# Patient Record
Sex: Female | Born: 1960 | Race: White | Hispanic: No | Marital: Married | Smoking: Former smoker
Health system: Southern US, Community
[De-identification: ages and names within clinical notes are randomized; demographics above are authoritative.]

## PROBLEM LIST (undated history)

## (undated) DIAGNOSIS — E119 Type 2 diabetes mellitus without complications: Secondary | ICD-10-CM

## (undated) DIAGNOSIS — L409 Psoriasis, unspecified: Secondary | ICD-10-CM

## (undated) DIAGNOSIS — I10 Essential (primary) hypertension: Secondary | ICD-10-CM

## (undated) DIAGNOSIS — N809 Endometriosis, unspecified: Secondary | ICD-10-CM

## (undated) HISTORY — DX: Type 2 diabetes mellitus without complications: E11.9

## (undated) HISTORY — PX: LIPOMA EXCISION: SHX5283

## (undated) HISTORY — PX: OTHER SURGICAL HISTORY: SHX169

## (undated) HISTORY — PX: ENDOMETRIAL ABLATION: SHX621

---

## 1993-03-02 HISTORY — PX: BREAST CYST ASPIRATION: SHX578

## 1997-08-13 ENCOUNTER — Ambulatory Visit (HOSPITAL_COMMUNITY): Admission: RE | Admit: 1997-08-13 | Discharge: 1997-08-13 | Payer: Self-pay | Admitting: Obstetrics and Gynecology

## 1999-09-19 ENCOUNTER — Other Ambulatory Visit: Admission: RE | Admit: 1999-09-19 | Discharge: 1999-09-19 | Payer: Self-pay | Admitting: Obstetrics and Gynecology

## 2000-03-09 ENCOUNTER — Ambulatory Visit (HOSPITAL_COMMUNITY): Admission: RE | Admit: 2000-03-09 | Discharge: 2000-03-09 | Payer: Self-pay | Admitting: Obstetrics and Gynecology

## 2000-03-09 ENCOUNTER — Encounter: Payer: Self-pay | Admitting: Obstetrics and Gynecology

## 2000-03-17 ENCOUNTER — Encounter: Payer: Self-pay | Admitting: Obstetrics and Gynecology

## 2000-03-17 ENCOUNTER — Encounter: Admission: RE | Admit: 2000-03-17 | Discharge: 2000-03-17 | Payer: Self-pay | Admitting: Obstetrics and Gynecology

## 2000-09-10 ENCOUNTER — Encounter: Payer: Self-pay | Admitting: Obstetrics and Gynecology

## 2000-09-10 ENCOUNTER — Encounter: Admission: RE | Admit: 2000-09-10 | Discharge: 2000-09-10 | Payer: Self-pay | Admitting: Obstetrics and Gynecology

## 2001-05-20 ENCOUNTER — Encounter: Admission: RE | Admit: 2001-05-20 | Discharge: 2001-08-18 | Payer: Self-pay | Admitting: Family Medicine

## 2001-06-08 ENCOUNTER — Encounter: Payer: Self-pay | Admitting: Obstetrics and Gynecology

## 2001-06-08 ENCOUNTER — Encounter: Admission: RE | Admit: 2001-06-08 | Discharge: 2001-06-08 | Payer: Self-pay | Admitting: Obstetrics and Gynecology

## 2002-02-21 ENCOUNTER — Other Ambulatory Visit: Admission: RE | Admit: 2002-02-21 | Discharge: 2002-02-21 | Payer: Self-pay | Admitting: Obstetrics and Gynecology

## 2002-06-19 ENCOUNTER — Encounter: Payer: Self-pay | Admitting: Obstetrics and Gynecology

## 2002-06-19 ENCOUNTER — Encounter: Admission: RE | Admit: 2002-06-19 | Discharge: 2002-06-19 | Payer: Self-pay | Admitting: Obstetrics and Gynecology

## 2003-04-30 ENCOUNTER — Other Ambulatory Visit: Admission: RE | Admit: 2003-04-30 | Discharge: 2003-04-30 | Payer: Self-pay | Admitting: Obstetrics and Gynecology

## 2003-06-20 ENCOUNTER — Encounter: Admission: RE | Admit: 2003-06-20 | Discharge: 2003-06-20 | Payer: Self-pay | Admitting: Obstetrics and Gynecology

## 2004-05-06 ENCOUNTER — Other Ambulatory Visit: Admission: RE | Admit: 2004-05-06 | Discharge: 2004-05-06 | Payer: Self-pay | Admitting: Obstetrics and Gynecology

## 2004-07-22 ENCOUNTER — Encounter: Admission: RE | Admit: 2004-07-22 | Discharge: 2004-07-22 | Payer: Self-pay | Admitting: Obstetrics and Gynecology

## 2005-07-23 ENCOUNTER — Encounter: Admission: RE | Admit: 2005-07-23 | Discharge: 2005-07-23 | Payer: Self-pay | Admitting: Obstetrics and Gynecology

## 2006-07-28 ENCOUNTER — Encounter: Admission: RE | Admit: 2006-07-28 | Discharge: 2006-07-28 | Payer: Self-pay | Admitting: Obstetrics and Gynecology

## 2006-07-30 ENCOUNTER — Encounter: Admission: RE | Admit: 2006-07-30 | Discharge: 2006-07-30 | Payer: Self-pay | Admitting: Obstetrics and Gynecology

## 2007-07-05 ENCOUNTER — Encounter: Admission: RE | Admit: 2007-07-05 | Discharge: 2007-07-05 | Payer: Self-pay | Admitting: Obstetrics and Gynecology

## 2008-08-14 ENCOUNTER — Encounter: Admission: RE | Admit: 2008-08-14 | Discharge: 2008-08-14 | Payer: Self-pay | Admitting: Obstetrics and Gynecology

## 2008-08-27 ENCOUNTER — Encounter: Admission: RE | Admit: 2008-08-27 | Discharge: 2008-08-27 | Payer: Self-pay | Admitting: Obstetrics and Gynecology

## 2009-08-27 ENCOUNTER — Encounter: Admission: RE | Admit: 2009-08-27 | Discharge: 2009-08-27 | Payer: Self-pay | Admitting: Obstetrics and Gynecology

## 2010-02-18 IMAGING — MG MM DIAGNOSTIC LTD LEFT
2 series · 2 of 2 positions shown · non-contrast
Comparison: 07/05/2007, and 07/28/2006, 07/23/2005

CLINICAL DATA: Asymmetry left breast associated with calcifications

DIGITAL DIAGNOSTIC LEFT MAMMOGRAM

[L CC]
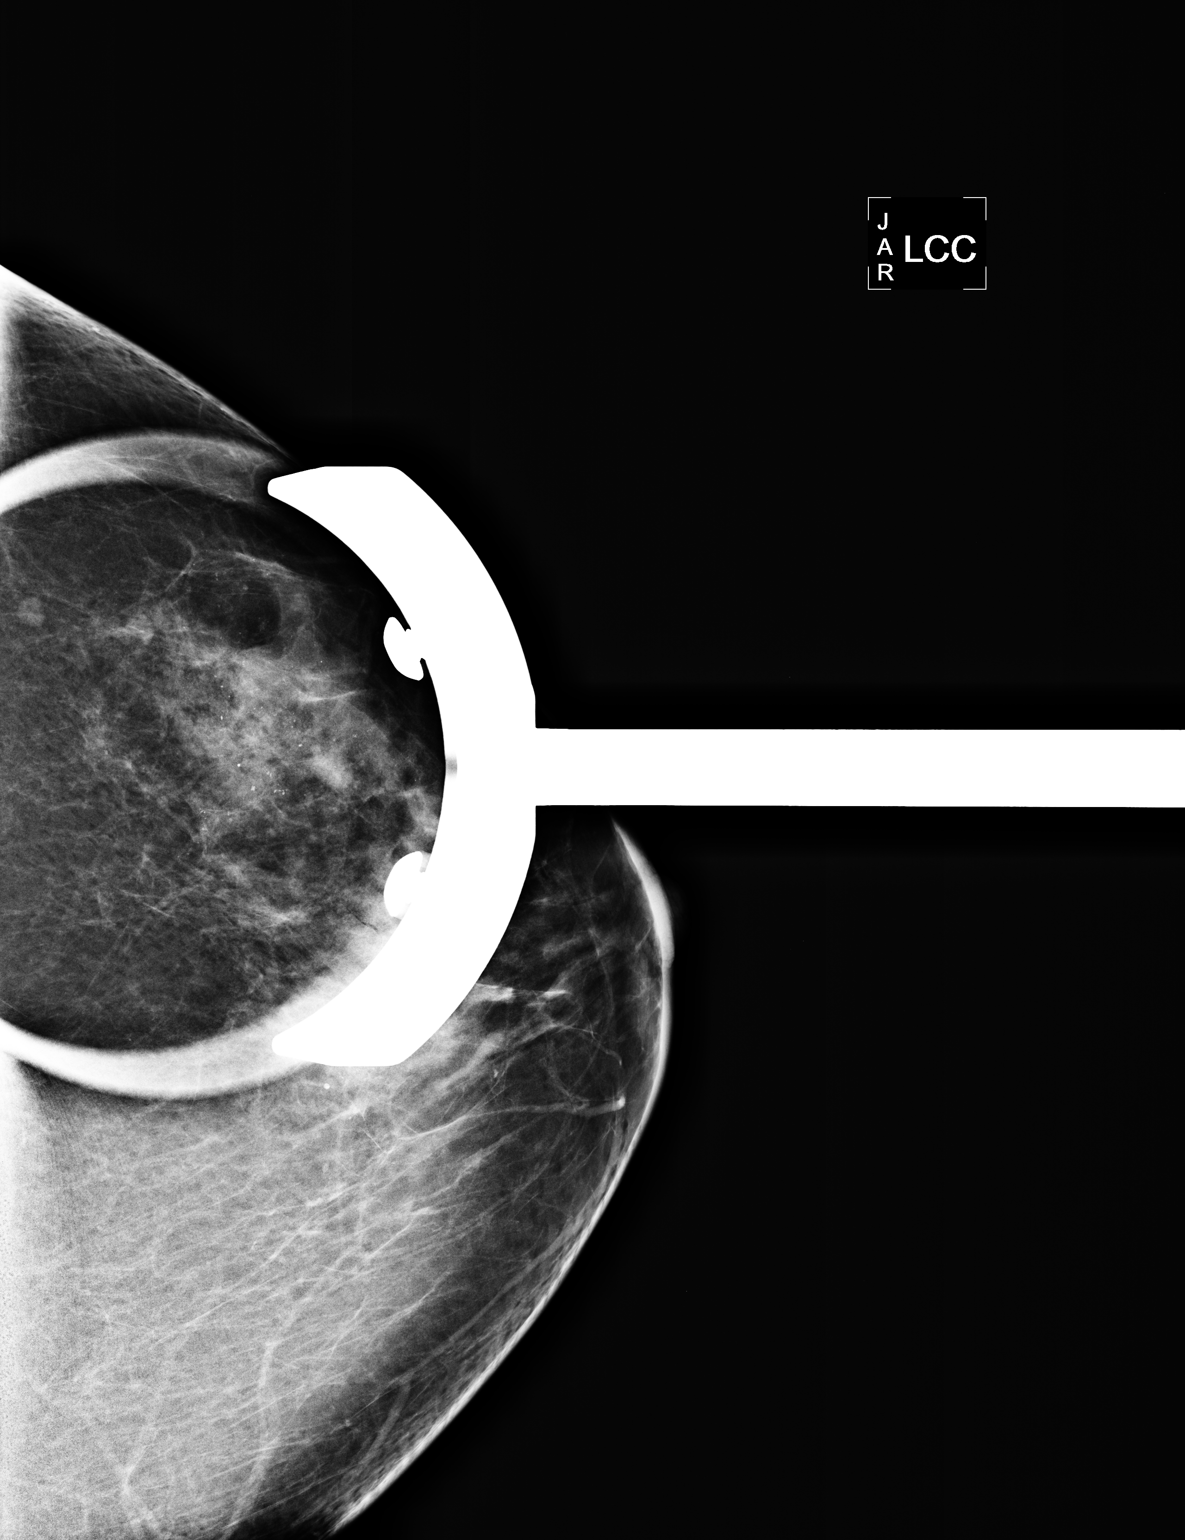

[L MLO]
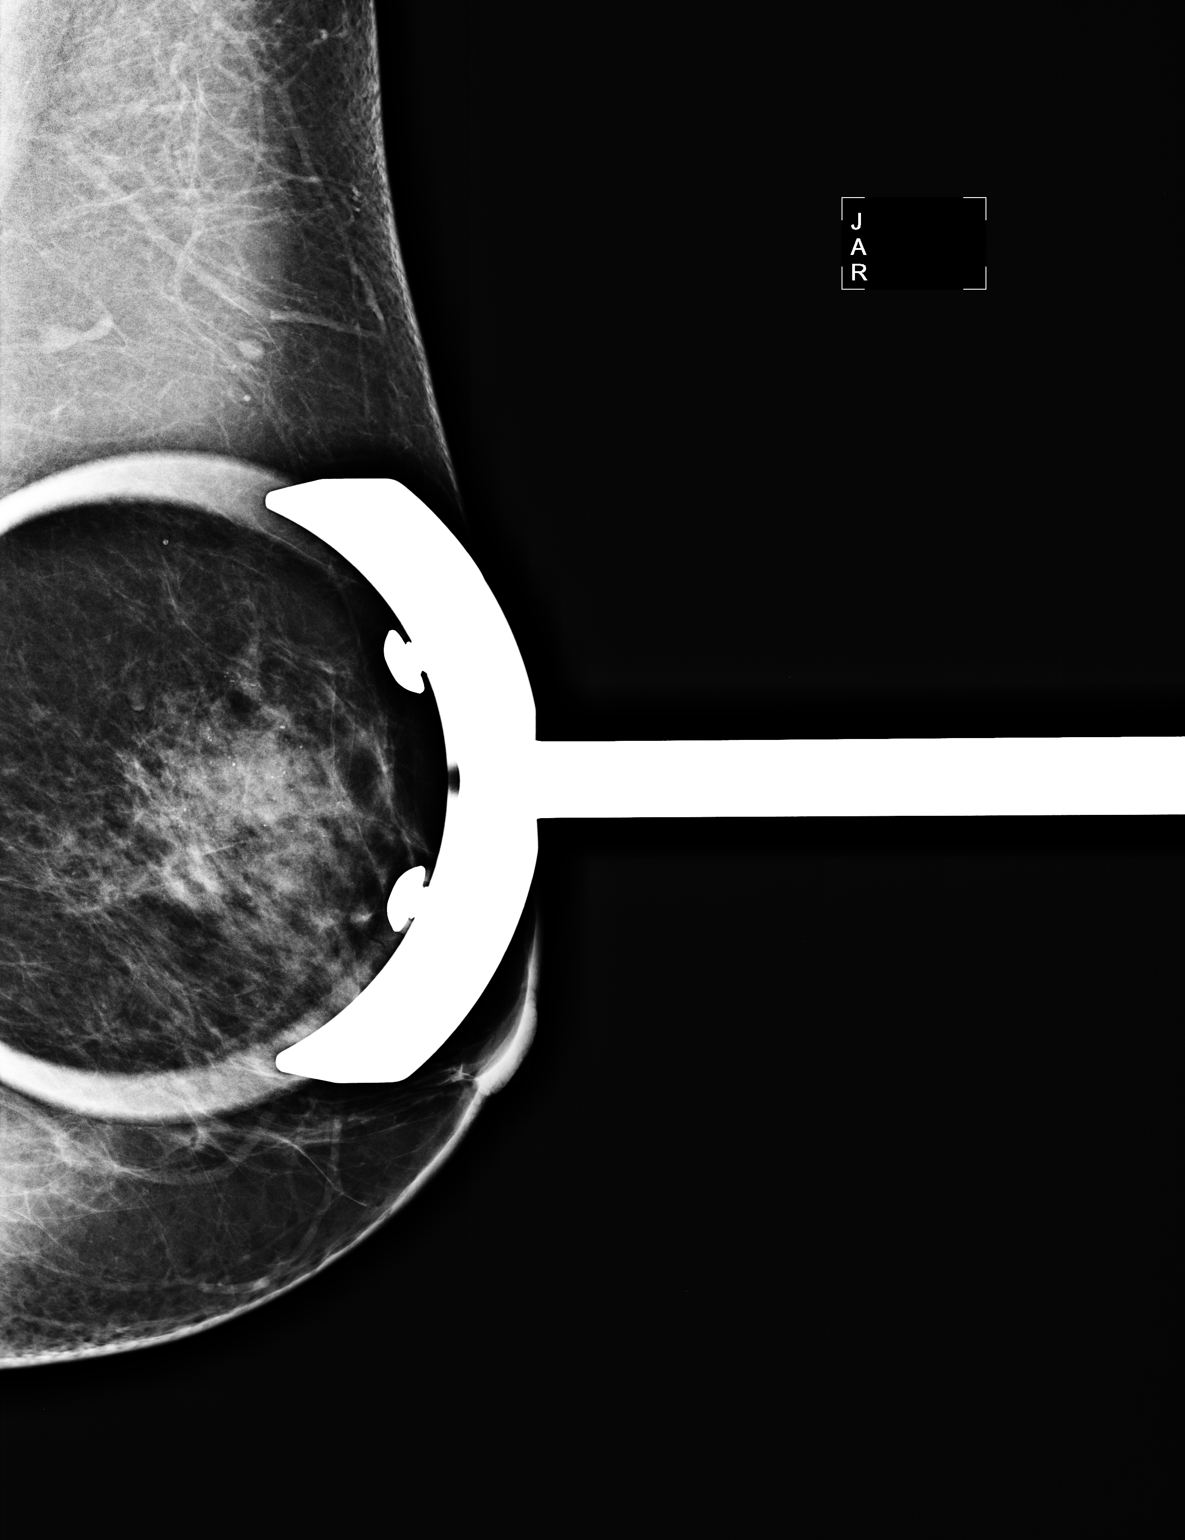

[2 of 2 positions shown; findings below may reference images not displayed]

FINDINGS: An island of mildly asymmetric fibroglandular tissue in
the upper outer quadrant of the left breast anteriorly is
associated with regional punctate calcifications.  The appearance
is identical to 2556 on spot compression views.
IMPRESSION: Benign calcifications and asymmetric breast tissue.

BI-RADS CATEGORY 2:  Benign finding(s).

Recommendation:

Screening bilateral mammogram in 1 year.

## 2010-03-24 ENCOUNTER — Encounter: Payer: Self-pay | Admitting: Obstetrics and Gynecology

## 2010-08-01 ENCOUNTER — Other Ambulatory Visit: Payer: Self-pay | Admitting: Obstetrics and Gynecology

## 2010-08-01 DIAGNOSIS — Z1231 Encounter for screening mammogram for malignant neoplasm of breast: Secondary | ICD-10-CM

## 2010-08-29 ENCOUNTER — Ambulatory Visit
Admission: RE | Admit: 2010-08-29 | Discharge: 2010-08-29 | Disposition: A | Discharge status: Home / Self Care | Payer: 59 | Source: Ambulatory Visit | Attending: Obstetrics and Gynecology | Admitting: Obstetrics and Gynecology

## 2010-08-29 DIAGNOSIS — Z1231 Encounter for screening mammogram for malignant neoplasm of breast: Secondary | ICD-10-CM

## 2011-09-22 ENCOUNTER — Other Ambulatory Visit: Payer: Self-pay | Admitting: Obstetrics and Gynecology

## 2011-09-22 DIAGNOSIS — Z1231 Encounter for screening mammogram for malignant neoplasm of breast: Secondary | ICD-10-CM

## 2011-09-29 ENCOUNTER — Ambulatory Visit: Payer: 59

## 2011-09-30 ENCOUNTER — Ambulatory Visit
Admission: RE | Admit: 2011-09-30 | Discharge: 2011-09-30 | Disposition: A | Discharge status: Home / Self Care | Payer: 59 | Source: Ambulatory Visit | Attending: Obstetrics and Gynecology | Admitting: Obstetrics and Gynecology

## 2011-09-30 DIAGNOSIS — Z1231 Encounter for screening mammogram for malignant neoplasm of breast: Secondary | ICD-10-CM

## 2011-10-02 ENCOUNTER — Ambulatory Visit: Payer: 59

## 2012-09-15 ENCOUNTER — Other Ambulatory Visit: Payer: Self-pay

## 2012-09-15 DIAGNOSIS — Z1231 Encounter for screening mammogram for malignant neoplasm of breast: Secondary | ICD-10-CM

## 2012-10-12 ENCOUNTER — Ambulatory Visit
Admission: RE | Admit: 2012-10-12 | Discharge: 2012-10-12 | Disposition: A | Discharge status: Home / Self Care | Payer: Managed Care, Other (non HMO) | Source: Ambulatory Visit

## 2012-10-12 DIAGNOSIS — Z1231 Encounter for screening mammogram for malignant neoplasm of breast: Secondary | ICD-10-CM

## 2013-08-07 ENCOUNTER — Other Ambulatory Visit: Payer: Self-pay

## 2013-08-07 DIAGNOSIS — Z1231 Encounter for screening mammogram for malignant neoplasm of breast: Secondary | ICD-10-CM

## 2013-10-13 ENCOUNTER — Ambulatory Visit
Admission: RE | Admit: 2013-10-13 | Discharge: 2013-10-13 | Disposition: A | Discharge status: Home / Self Care | Payer: Self-pay | Source: Ambulatory Visit

## 2013-10-13 ENCOUNTER — Encounter (INDEPENDENT_AMBULATORY_CARE_PROVIDER_SITE_OTHER): Payer: Self-pay

## 2013-10-13 DIAGNOSIS — Z1231 Encounter for screening mammogram for malignant neoplasm of breast: Secondary | ICD-10-CM

## 2013-10-17 ENCOUNTER — Other Ambulatory Visit: Payer: Self-pay | Admitting: Family Medicine

## 2013-10-17 DIAGNOSIS — R928 Other abnormal and inconclusive findings on diagnostic imaging of breast: Secondary | ICD-10-CM

## 2013-10-20 ENCOUNTER — Other Ambulatory Visit: Payer: Self-pay | Admitting: Family Medicine

## 2013-10-20 ENCOUNTER — Ambulatory Visit
Admission: RE | Admit: 2013-10-20 | Discharge: 2013-10-20 | Disposition: A | Discharge status: Home / Self Care | Payer: Commercial Indemnity | Source: Ambulatory Visit | Attending: Family Medicine | Admitting: Family Medicine

## 2013-10-20 ENCOUNTER — Ambulatory Visit
Admission: RE | Admit: 2013-10-20 | Discharge: 2013-10-20 | Disposition: A | Discharge status: Home / Self Care | Payer: Self-pay | Source: Ambulatory Visit | Attending: Family Medicine | Admitting: Family Medicine

## 2013-10-20 DIAGNOSIS — R928 Other abnormal and inconclusive findings on diagnostic imaging of breast: Secondary | ICD-10-CM

## 2014-03-26 ENCOUNTER — Other Ambulatory Visit: Payer: Self-pay | Admitting: Family Medicine

## 2014-03-26 DIAGNOSIS — N6320 Unspecified lump in the left breast, unspecified quadrant: Secondary | ICD-10-CM

## 2014-03-26 DIAGNOSIS — R921 Mammographic calcification found on diagnostic imaging of breast: Secondary | ICD-10-CM

## 2014-04-30 ENCOUNTER — Ambulatory Visit
Admission: RE | Admit: 2014-04-30 | Discharge: 2014-04-30 | Disposition: A | Discharge status: Home / Self Care | Payer: PRIVATE HEALTH INSURANCE | Source: Ambulatory Visit | Attending: Family Medicine | Admitting: Family Medicine

## 2014-04-30 ENCOUNTER — Other Ambulatory Visit: Payer: Self-pay | Admitting: Family Medicine

## 2014-04-30 DIAGNOSIS — R921 Mammographic calcification found on diagnostic imaging of breast: Secondary | ICD-10-CM

## 2014-04-30 DIAGNOSIS — N6320 Unspecified lump in the left breast, unspecified quadrant: Secondary | ICD-10-CM

## 2014-05-08 ENCOUNTER — Other Ambulatory Visit: Payer: Self-pay | Admitting: Family Medicine

## 2014-05-08 ENCOUNTER — Other Ambulatory Visit: Payer: PRIVATE HEALTH INSURANCE

## 2014-05-08 ENCOUNTER — Ambulatory Visit
Admission: RE | Admit: 2014-05-08 | Discharge: 2014-05-08 | Disposition: A | Discharge status: Home / Self Care | Payer: BLUE CROSS/BLUE SHIELD | Source: Ambulatory Visit | Attending: Family Medicine | Admitting: Family Medicine

## 2014-05-08 DIAGNOSIS — R921 Mammographic calcification found on diagnostic imaging of breast: Secondary | ICD-10-CM

## 2014-05-22 ENCOUNTER — Other Ambulatory Visit: Payer: Self-pay | Admitting: General Surgery

## 2014-05-22 DIAGNOSIS — R921 Mammographic calcification found on diagnostic imaging of breast: Secondary | ICD-10-CM | POA: Insufficient documentation

## 2014-05-23 ENCOUNTER — Other Ambulatory Visit: Payer: Self-pay | Admitting: General Surgery

## 2014-05-23 DIAGNOSIS — R921 Mammographic calcification found on diagnostic imaging of breast: Secondary | ICD-10-CM

## 2014-06-09 NOTE — Pre-Procedure Instructions (Signed)
Robynn PaneKaren R Awe  06/09/2014   Your procedure is scheduled on:  April 19  [facility] at 05:30 AM.  Call this number if you have problems the morning of surgery: [phone]   Remember:   Do not eat food or drink liquids after midnight.   Take these medicines the morning of surgery with A SIP OF WATER: None   STOP/ Do not take Aspirin, Aleve, Naproxen, Advil, Ibuprofen, Motrin, Vitamins, Herbs, or Supplements starting today   Do not wear jewelry, make-up or nail polish.  Do not wear lotions, powders, or perfumes. You may wear deodorant.  Do not shave 48 hours prior to surgery. Men may shave face and neck.  Do not bring valuables to the hospital.  [facility] is not responsible for any belongings or valuables.               Contacts, dentures or bridgework may not be worn into surgery.  Leave suitcase in the car. After surgery it may be brought to your room.  For patients admitted to the hospital, discharge time is determined by your treatment team.               Special Instructions: [place] - Preparing for Surgery  Before surgery, you can play an important role.  Because skin is not sterile, your skin needs to be as free of germs as possible.  You can reduce the number of germs on you skin by washing with CHG (chlorahexidine gluconate) soap before surgery.  CHG is an antiseptic cleaner which kills germs and bonds with the skin to continue killing germs even after washing.  Please DO NOT use if you have an allergy to CHG or antibacterial soaps.  If your skin becomes reddened/irritated stop using the CHG and inform your nurse when you arrive at Short Stay.  Do not shave (including legs and underarms) for at least 48 hours prior to the first CHG shower.  You may shave your face.  Please follow these instructions carefully:   1.  Shower with CHG Soap the night before surgery and the morning of Surgery.  2.  If you choose to wash your hair, wash your  hair first as usual with your normal shampoo.  3.  After you shampoo, rinse your hair and body thoroughly to remove the shampoo.  4.  Use CHG as you would any other liquid soap.  You can apply CHG directly to the skin and wash gently with scrungie or a clean washcloth.  5.  Apply the CHG Soap to your body ONLY FROM THE NECK DOWN.  Do not use on open wounds or open sores.  Avoid contact with your eyes, ears, mouth and genitals (private parts).  Wash genitals (private parts) with your normal soap.  6.  Wash thoroughly, paying special attention to the area where your surgery will be performed.  7.  Thoroughly rinse your body with warm water from the neck down.  8.  DO NOT shower/wash with your normal soap after using and rinsing off the CHG Soap.  9.  Pat yourself dry with a clean towel.            10.  Wear clean pajamas.            11.  Place clean sheets on your bed the night of your first shower and do not sleep with pets.  Day of Surgery  Do not apply any lotions the morning of  surgery.  Please wear clean clothes to the hospital/surgery center.     Please read over the following fact sheets that you were given: Pain Booklet, Coughing and Deep Breathing and Surgical Site Infection Prevention

## 2014-06-11 ENCOUNTER — Encounter (HOSPITAL_COMMUNITY): Payer: Self-pay

## 2014-06-11 ENCOUNTER — Encounter (HOSPITAL_COMMUNITY)
Admission: RE | Admit: 2014-06-11 | Discharge: 2014-06-11 | Disposition: A | Discharge status: Home / Self Care | Payer: BLUE CROSS/BLUE SHIELD | Source: Ambulatory Visit | Attending: General Surgery | Admitting: General Surgery

## 2014-06-11 DIAGNOSIS — Z01812 Encounter for preprocedural laboratory examination: Secondary | ICD-10-CM | POA: Diagnosis present

## 2014-06-11 DIAGNOSIS — Z01810 Encounter for preprocedural cardiovascular examination: Secondary | ICD-10-CM | POA: Insufficient documentation

## 2014-06-11 HISTORY — DX: Essential (primary) hypertension: I10

## 2014-06-11 HISTORY — DX: Psoriasis, unspecified: L40.9

## 2014-06-11 HISTORY — DX: Endometriosis, unspecified: N80.9

## 2014-06-11 LAB — BASIC METABOLIC PANEL
ANION GAP: 12 (ref 5–15)
BUN: 13 mg/dL (ref 6–23)
CHLORIDE: 104 mmol/L (ref 96–112)
CO2: 22 mmol/L (ref 19–32)
Calcium: 9.5 mg/dL (ref 8.4–10.5)
Creatinine, Ser: 0.61 mg/dL (ref 0.50–1.10)
GFR calc Af Amer: >90 mL/min (ref >90)
GFR calc non Af Amer: >90 mL/min (ref >90)
Glucose, Bld: 121 mg/dL — ABNORMAL HIGH (ref 70–99)
POTASSIUM: 4.1 mmol/L (ref 3.5–5.1)
Sodium: 138 mmol/L (ref 135–145)

## 2014-06-11 LAB — CBC WITH DIFFERENTIAL/PLATELET
Basophils Absolute: 0 10*3/uL (ref 0.0–0.1)
Basophils Relative: 0 % (ref 0–1)
Eosinophils Absolute: 0.3 10*3/uL (ref 0.0–0.7)
Eosinophils Relative: 3 % (ref 0–5)
HCT: 42.6 % (ref 36.0–46.0)
Hemoglobin: 13.9 g/dL (ref 12.0–15.0)
Lymphocytes Relative: 28 % (ref 12–46)
Lymphs Abs: 2.5 10*3/uL (ref 0.7–4.0)
MCH: 29.5 pg (ref 26.0–34.0)
MCHC: 32.6 g/dL (ref 30.0–36.0)
MCV: 90.4 fL (ref 78.0–100.0)
MONO ABS: 0.6 10*3/uL (ref 0.1–1.0)
MONOS PCT: 7 % (ref 3–12)
Neutro Abs: 5.6 10*3/uL (ref 1.7–7.7)
Neutrophils Relative %: 62 % (ref 43–77)
Platelets: 327 10*3/uL (ref 150–400)
RBC: 4.71 MIL/uL (ref 3.87–5.11)
RDW: 13.5 % (ref 11.5–15.5)
WBC: 9.1 10*3/uL (ref 4.0–10.5)

## 2014-06-11 NOTE — Progress Notes (Signed)
Requested old EKG and stress test from Dr. Clarene DukeLittle

## 2014-06-11 NOTE — Progress Notes (Signed)
PCP Little, Caryn BeeKevin at [facility]. Denies CP, Shob, reports stress test > 5 years ago d/t right sided chest pain determined to be indigestion. Stress test arranged through Dr. Clarene DukeLittle.

## 2014-06-11 NOTE — Progress Notes (Signed)
   06/11/14 0923  OBSTRUCTIVE SLEEP APNEA  Have you ever been diagnosed with sleep apnea through a sleep study? No  Do you snore loudly (loud enough to be heard through closed doors)?  1  Do you often feel tired, fatigued, or sleepy during the daytime? 1  Has anyone observed you stop breathing during your sleep? 0  Do you have, or are you being treated for high blood pressure? 1  BMI more than 35 kg/m2? 1  Age over 55 years old? 1  Neck circumference greater than 40 cm/16 inches? 1  Gender: 0  Obstructive Sleep Apnea Score 6

## 2014-06-12 NOTE — Progress Notes (Signed)
Anesthesia Chart Review:  Pt is 54 year old female scheduled for L breast radioactive seed guided excision of L breast mass on 06/19/2014 with Dr. Dwain SarnaWakefield.   PMH includes: HTN.  Former smoker. BMI 36.   Preoperative labs reviewed.    EKG: NSR.   If no changes, I anticipate pt can proceed with surgery as scheduled.   Rica Mastngela Nolton Denis, FNP-BC [facility]/Anesthesiology Phone: [phone] 06/12/2014 12:22 PM

## 2014-06-18 ENCOUNTER — Ambulatory Visit
Admission: RE | Admit: 2014-06-18 | Discharge: 2014-06-18 | Disposition: A | Discharge status: Home / Self Care | Payer: BLUE CROSS/BLUE SHIELD | Source: Ambulatory Visit | Attending: General Surgery | Admitting: General Surgery

## 2014-06-18 DIAGNOSIS — R921 Mammographic calcification found on diagnostic imaging of breast: Secondary | ICD-10-CM

## 2014-06-18 MED ORDER — CEFAZOLIN SODIUM-DEXTROSE 2-3 GM-% IV SOLR
2.0000 g | INTRAVENOUS | Status: AC
  Administered 2014-06-19: 2 g via INTRAVENOUS
  Filled 2014-06-18: qty 50

## 2014-06-19 ENCOUNTER — Ambulatory Visit (HOSPITAL_COMMUNITY)
Admission: RE | Admit: 2014-06-19 | Discharge: 2014-06-19 | Disposition: A | Discharge status: Home / Self Care | Payer: BLUE CROSS/BLUE SHIELD | Source: Ambulatory Visit | Attending: General Surgery | Admitting: General Surgery

## 2014-06-19 ENCOUNTER — Encounter (HOSPITAL_COMMUNITY): Payer: Self-pay | Admitting: *Deleted

## 2014-06-19 ENCOUNTER — Ambulatory Visit
Admission: RE | Admit: 2014-06-19 | Discharge: 2014-06-19 | Disposition: A | Discharge status: Home / Self Care | Payer: BLUE CROSS/BLUE SHIELD | Source: Ambulatory Visit | Attending: General Surgery | Admitting: General Surgery

## 2014-06-19 ENCOUNTER — Ambulatory Visit (HOSPITAL_COMMUNITY): Payer: BLUE CROSS/BLUE SHIELD | Admitting: Anesthesiology

## 2014-06-19 ENCOUNTER — Ambulatory Visit (HOSPITAL_COMMUNITY): Payer: BLUE CROSS/BLUE SHIELD | Admitting: Emergency Medicine

## 2014-06-19 ENCOUNTER — Encounter (HOSPITAL_COMMUNITY)
Admission: RE | Disposition: A | Discharge status: Home / Self Care | Payer: Self-pay | Source: Ambulatory Visit | Attending: General Surgery

## 2014-06-19 DIAGNOSIS — Z859 Personal history of malignant neoplasm, unspecified: Secondary | ICD-10-CM | POA: Diagnosis not present

## 2014-06-19 DIAGNOSIS — R921 Mammographic calcification found on diagnostic imaging of breast: Secondary | ICD-10-CM

## 2014-06-19 DIAGNOSIS — Z87891 Personal history of nicotine dependence: Secondary | ICD-10-CM | POA: Insufficient documentation

## 2014-06-19 DIAGNOSIS — N6092 Unspecified benign mammary dysplasia of left breast: Secondary | ICD-10-CM | POA: Insufficient documentation

## 2014-06-19 DIAGNOSIS — Z79899 Other long term (current) drug therapy: Secondary | ICD-10-CM | POA: Diagnosis not present

## 2014-06-19 DIAGNOSIS — Z6836 Body mass index (BMI) 36.0-36.9, adult: Secondary | ICD-10-CM | POA: Diagnosis not present

## 2014-06-19 DIAGNOSIS — E669 Obesity, unspecified: Secondary | ICD-10-CM | POA: Diagnosis not present

## 2014-06-19 DIAGNOSIS — I10 Essential (primary) hypertension: Secondary | ICD-10-CM | POA: Insufficient documentation

## 2014-06-19 DIAGNOSIS — N63 Unspecified lump in breast: Secondary | ICD-10-CM | POA: Diagnosis present

## 2014-06-19 DIAGNOSIS — D0502 Lobular carcinoma in situ of left breast: Secondary | ICD-10-CM | POA: Insufficient documentation

## 2014-06-19 DIAGNOSIS — N6012 Diffuse cystic mastopathy of left breast: Secondary | ICD-10-CM | POA: Insufficient documentation

## 2014-06-19 HISTORY — PX: BREAST LUMPECTOMY WITH RADIOACTIVE SEED LOCALIZATION: SHX6424

## 2014-06-19 SURGERY — BREAST LUMPECTOMY WITH RADIOACTIVE SEED LOCALIZATION
Anesthesia: General | Site: Breast | Laterality: Left

## 2014-06-19 MED ORDER — FENTANYL CITRATE (PF) 100 MCG/2ML IJ SOLN
INTRAMUSCULAR | Status: DC | PRN
  Administered 2014-06-19: 50 ug via INTRAVENOUS
  Administered 2014-06-19 (×2): 25 ug via INTRAVENOUS

## 2014-06-19 MED ORDER — LACTATED RINGERS IV SOLN
INTRAVENOUS | Status: DC | PRN
  Administered 2014-06-19: 07:00:00 via INTRAVENOUS

## 2014-06-19 MED ORDER — SUCCINYLCHOLINE CHLORIDE 20 MG/ML IJ SOLN
INTRAMUSCULAR | Status: AC
  Filled 2014-06-19: qty 1

## 2014-06-19 MED ORDER — PROPOFOL 10 MG/ML IV BOLUS
INTRAVENOUS | Status: AC
  Filled 2014-06-19: qty 20

## 2014-06-19 MED ORDER — BUPIVACAINE-EPINEPHRINE 0.25% -1:200000 IJ SOLN
INTRAMUSCULAR | Status: DC | PRN
  Administered 2014-06-19: 17 mL

## 2014-06-19 MED ORDER — EPHEDRINE SULFATE 50 MG/ML IJ SOLN
INTRAMUSCULAR | Status: AC
  Filled 2014-06-19: qty 1

## 2014-06-19 MED ORDER — OXYCODONE HCL 5 MG/5ML PO SOLN: 5.0000 mg | Freq: Once | ORAL | Status: DC | PRN

## 2014-06-19 MED ORDER — LIDOCAINE HCL (CARDIAC) 20 MG/ML IV SOLN
INTRAVENOUS | Status: DC | PRN
  Administered 2014-06-19: 60 mg via INTRAVENOUS

## 2014-06-19 MED ORDER — SODIUM CHLORIDE 0.9 % IV SOLN
INTRAVENOUS | Status: DC
Start: 2014-06-19 — End: 2014-06-19

## 2014-06-19 MED ORDER — SODIUM CHLORIDE 0.9 % IJ SOLN
INTRAMUSCULAR | Status: AC
  Filled 2014-06-19: qty 10

## 2014-06-19 MED ORDER — FENTANYL CITRATE (PF) 250 MCG/5ML IJ SOLN
INTRAMUSCULAR | Status: AC
  Filled 2014-06-19: qty 5

## 2014-06-19 MED ORDER — DEXAMETHASONE SODIUM PHOSPHATE 10 MG/ML IJ SOLN
INTRAMUSCULAR | Status: DC | PRN
  Administered 2014-06-19: 10 mg via INTRAVENOUS

## 2014-06-19 MED ORDER — ARTIFICIAL TEARS OP OINT
TOPICAL_OINTMENT | OPHTHALMIC | Status: AC
  Filled 2014-06-19: qty 3.5

## 2014-06-19 MED ORDER — ONDANSETRON HCL 4 MG/2ML IJ SOLN
INTRAMUSCULAR | Status: DC | PRN
  Administered 2014-06-19: 4 mg via INTRAVENOUS

## 2014-06-19 MED ORDER — ONDANSETRON HCL 4 MG/2ML IJ SOLN
INTRAMUSCULAR | Status: AC
  Filled 2014-06-19: qty 4

## 2014-06-19 MED ORDER — LIDOCAINE HCL (CARDIAC) 20 MG/ML IV SOLN
INTRAVENOUS | Status: AC
  Filled 2014-06-19: qty 10

## 2014-06-19 MED ORDER — MIDAZOLAM HCL 5 MG/5ML IJ SOLN
INTRAMUSCULAR | Status: DC | PRN
  Administered 2014-06-19: 2 mg via INTRAVENOUS

## 2014-06-19 MED ORDER — 0.9 % SODIUM CHLORIDE (POUR BTL) OPTIME
TOPICAL | Status: DC | PRN
  Administered 2014-06-19: 1000 mL

## 2014-06-19 MED ORDER — ROCURONIUM BROMIDE 50 MG/5ML IV SOLN
INTRAVENOUS | Status: AC
  Filled 2014-06-19: qty 1

## 2014-06-19 MED ORDER — PROPOFOL 10 MG/ML IV BOLUS
INTRAVENOUS | Status: DC | PRN
  Administered 2014-06-19: 200 mg via INTRAVENOUS

## 2014-06-19 MED ORDER — MIDAZOLAM HCL 2 MG/2ML IJ SOLN
INTRAMUSCULAR | Status: AC
  Filled 2014-06-19: qty 2

## 2014-06-19 MED ORDER — BUPIVACAINE-EPINEPHRINE (PF) 0.5% -1:200000 IJ SOLN
INTRAMUSCULAR | Status: AC
  Filled 2014-06-19: qty 30

## 2014-06-19 MED ORDER — OXYCODONE-ACETAMINOPHEN 10-325 MG PO TABS
1.0000 | ORAL_TABLET | Freq: Four times a day (QID) | ORAL | Status: AC | PRN
Start: 2014-06-19 — End: 2015-06-19

## 2014-06-19 MED ORDER — HYDROMORPHONE HCL 1 MG/ML IJ SOLN: 0.2500 mg | INTRAMUSCULAR | Status: DC | PRN

## 2014-06-19 MED ORDER — OXYCODONE HCL 5 MG PO TABS: 5.0000 mg | ORAL_TABLET | Freq: Once | ORAL | Status: DC | PRN

## 2014-06-19 MED ORDER — ONDANSETRON HCL 4 MG/2ML IJ SOLN: 4.0000 mg | Freq: Four times a day (QID) | INTRAMUSCULAR | Status: DC | PRN

## 2014-06-19 MED ORDER — BUPIVACAINE-EPINEPHRINE (PF) 0.25% -1:200000 IJ SOLN
INTRAMUSCULAR | Status: AC
  Filled 2014-06-19: qty 30

## 2014-06-19 SURGICAL SUPPLY — 44 items
APPLIER CLIP 9.375 MED OPEN (MISCELLANEOUS)
BINDER BREAST LRG (GAUZE/BANDAGES/DRESSINGS) IMPLANT
BINDER BREAST XLRG (GAUZE/BANDAGES/DRESSINGS) ×2 IMPLANT
BLADE SURG 10 STRL SS (BLADE) ×2 IMPLANT
BLADE SURG 15 STRL LF DISP TIS (BLADE) ×1 IMPLANT
BLADE SURG 15 STRL SS (BLADE) ×1
CANISTER SUCTION 2500CC (MISCELLANEOUS) IMPLANT
CHLORAPREP W/TINT 26ML (MISCELLANEOUS) ×2 IMPLANT
CLIP APPLIE 9.375 MED OPEN (MISCELLANEOUS) IMPLANT
COVER PROBE W GEL 5X96 (DRAPES) ×2 IMPLANT
DEVICE DUBIN SPECIMEN MAMMOGRA (MISCELLANEOUS) ×2 IMPLANT
DRAPE CHEST BREAST 15X10 FENES (DRAPES) ×2 IMPLANT
ELECT COATED BLADE 2.86 ST (ELECTRODE) ×2 IMPLANT
ELECT REM PT RETURN 9FT ADLT (ELECTROSURGICAL) ×2
ELECTRODE REM PT RTRN 9FT ADLT (ELECTROSURGICAL) ×1 IMPLANT
GAUZE SPONGE 4X4 12PLY STRL (GAUZE/BANDAGES/DRESSINGS) ×2 IMPLANT
GLOVE BIO SURGEON STRL SZ7 (GLOVE) ×4 IMPLANT
GLOVE BIOGEL PI IND STRL 7.0 (GLOVE) ×1 IMPLANT
GLOVE BIOGEL PI IND STRL 7.5 (GLOVE) ×2 IMPLANT
GLOVE BIOGEL PI INDICATOR 7.0 (GLOVE) ×1
GLOVE BIOGEL PI INDICATOR 7.5 (GLOVE) ×2
GLOVE SURG SS PI 7.0 STRL IVOR (GLOVE) ×2 IMPLANT
GOWN STRL REUS W/ TWL LRG LVL3 (GOWN DISPOSABLE) ×2 IMPLANT
GOWN STRL REUS W/TWL LRG LVL3 (GOWN DISPOSABLE) ×2
KIT BASIN OR (CUSTOM PROCEDURE TRAY) ×2 IMPLANT
KIT MARKER MARGIN INK (KITS) ×2 IMPLANT
LIQUID BAND (GAUZE/BANDAGES/DRESSINGS) ×2 IMPLANT
NEEDLE HYPO 25X1 1.5 SAFETY (NEEDLE) ×2 IMPLANT
NS IRRIG 1000ML POUR BTL (IV SOLUTION) ×2 IMPLANT
PACK SURGICAL SETUP 50X90 (CUSTOM PROCEDURE TRAY) ×2 IMPLANT
PENCIL BUTTON HOLSTER BLD 10FT (ELECTRODE) ×2 IMPLANT
SPONGE LAP 18X18 X RAY DECT (DISPOSABLE) ×2 IMPLANT
STRIP CLOSURE SKIN 1/2X4 (GAUZE/BANDAGES/DRESSINGS) ×2 IMPLANT
SUT MNCRL AB 4-0 PS2 18 (SUTURE) ×2 IMPLANT
SUT SILK 2 0 SH (SUTURE) IMPLANT
SUT VIC AB 2-0 SH 27 (SUTURE) ×1
SUT VIC AB 2-0 SH 27XBRD (SUTURE) ×1 IMPLANT
SUT VIC AB 3-0 SH 27 (SUTURE) ×1
SUT VIC AB 3-0 SH 27X BRD (SUTURE) ×1 IMPLANT
SYR CONTROL 10ML LL (SYRINGE) ×2 IMPLANT
TOWEL OR 17X24 6PK STRL BLUE (TOWEL DISPOSABLE) ×2 IMPLANT
TOWEL OR 17X26 10 PK STRL BLUE (TOWEL DISPOSABLE) ×2 IMPLANT
TUBE CONNECTING 12X1/4 (SUCTIONS) IMPLANT
YANKAUER SUCT BULB TIP NO VENT (SUCTIONS) IMPLANT

## 2014-06-19 NOTE — Discharge Instructions (Signed)
[facility] °Office Phone Number [phone] °POST OP INSTRUCTIONS ° °Always review your discharge instruction sheet given to you by the facility where your surgery was performed. ° °IF YOU HAVE DISABILITY OR FAMILY LEAVE FORMS, YOU MUST BRING THEM TO THE OFFICE FOR PROCESSING.  DO NOT GIVE THEM TO YOUR DOCTOR. ° °1. A prescription for pain medication may be given to you upon discharge.  Take your pain medication as prescribed, if needed.  If narcotic pain medicine is not needed, then you may take acetaminophen (Tylenol), naprosyn (Alleve) or ibuprofen (Advil) as needed. °2. Take your usually prescribed medications unless otherwise directed °3. If you need a refill on your pain medication, please contact your pharmacy.  They will contact our office to request authorization.  Prescriptions will not be filled after 5pm or on week-ends. °4. You should eat very light the first 24 hours after surgery, such as soup, crackers, pudding, etc.  Resume your normal diet the day after surgery. °5. Most patients will experience some swelling and bruising in the breast.  Ice packs and a good support bra will help.  Wear the breast binder provided or a sports bra for 72 hours day and night.  After that wear a sports bra during the day until you return to the office. Swelling and bruising can take several days to resolve.  °6. It is common to experience some constipation if taking pain medication after surgery.  Increasing fluid intake and taking a stool softener will usually help or prevent this problem from occurring.  A mild laxative (Milk of Magnesia or Miralax) should be taken according to package directions if there are no bowel movements after 48 hours. °7. Unless discharge instructions indicate otherwise, you may remove your bandages 48 hours after surgery and you may shower at that time.  You may have steri-strips (small skin tapes) in place directly over the incision.  These strips should be left on the  skin for 7-10 days and will come off on their own.  If your surgeon used skin glue on the incision, you may shower in 24 hours.  The glue will flake off over the next 2-3 weeks.  Any sutures or staples will be removed at the office during your follow-up visit. °8. ACTIVITIES:  You may resume regular daily activities (gradually increasing) beginning the next day.  Wearing a good support bra or sports bra minimizes pain and swelling.  You may have sexual intercourse when it is comfortable. °a. You may drive when you no longer are taking prescription pain medication, you can comfortably wear a seatbelt, and you can safely maneuver your car and apply brakes. °b. RETURN TO WORK:  ______________________________________________________________________________________ °9. You should see your doctor in the office for a follow-up appointment approximately two weeks after your surgery.  Your doctor’s nurse will typically make your follow-up appointment when she calls you with your pathology report.  Expect your pathology report 3-4 business days after your surgery.  You may call to check if you do not hear from us after three days. °10. OTHER INSTRUCTIONS: _______________________________________________________________________________________________ _____________________________________________________________________________________________________________________________________ °_____________________________________________________________________________________________________________________________________ °_____________________________________________________________________________________________________________________________________ ° °WHEN TO CALL DR Juli Odom: °1. Fever over 101.0 °2. Nausea and/or vomiting. °3. Extreme swelling or bruising. °4. Continued bleeding from incision. °5. Increased pain, redness, or drainage from the incision. ° °The clinic staff is available to answer your questions during regular  business hours.  Please don’t hesitate to call and ask to speak to one of the nurses for clinical concerns.  If you   have a medical emergency, go to the nearest emergency room or call 911.  A surgeon from [facility] is always on call at the hospital. ° °For further questions, please visit [email or web address] ° °

## 2014-06-19 NOTE — Op Note (Signed)
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 Harden MoMatt Melissa Sanchez Anesthesia: Gen. Estimated blood loss: Minimal Specimens: Left breast tissue marked with paint Drains: None Occasions: None Sponge and needle count correct at completion Disposition to recovery stable  Indications: This is a 54 year old female with an abnormal left-sided mammogram that is undergone core biopsy showing atypical ductal hyperplasia. We discussed excision of this area to rule out associated carcinoma. She had a radioactive seed placed prior to beginning. These mammograms in the operating room.  Procedure: After informed consent was obtained the patient was taken to the operating room. I confirmed that the seed was in the breast prior to beginning. She was given antibiotics. Sequential compression devices were on her legs. She was then placed under general anesthesia with an LMA. Her breast was prepped and draped in the standard sterile surgical fashion. A surgical timeout was performed.  I located the radioactive seed with the neoprobe. I then infiltrated Marcaine throughout this region of the breast. I made a curvilinear incision overlying the radioactivity. I then used the neoprobe to guide the excision of the radioactive seed in the surrounding tissue. I confirm removal of the seed with the neoprobe once it was removed. There was no more radioactivity in the breast. I then painted the specimen. I then did a Faxitron mammogram which confirmed removal of the clip as well as the radioactive seed. This was then sent to pathology who confirmed removal. I then obtained hemostasis. I closed the breast with 2-0 Vicryl. The skin was closed with 3-0 Vicryl and 4-0 Monocryl. Steri-Strips were applied. A breast binder was placed. She tolerated this well and was extubated and transferred to  the recovery room in stable condition.

## 2014-06-19 NOTE — Progress Notes (Signed)
Called Dr.Hodierne for sign out  

## 2014-06-19 NOTE — Anesthesia Postprocedure Evaluation (Signed)
Anesthesia Post Note  Patient: Melissa Sanchez  Procedure(s) Performed: Procedure(s) (LRB): LEFT BREAST RADIOACTIVE SEED GUIDED EXCISIONAL BIOPSY (Left)  Anesthesia type: General  Patient location: PACU  Post pain: Pain level controlled and Adequate analgesia  Post assessment: Post-op Vital signs reviewed, Patient's Cardiovascular Status Stable, Respiratory Function Stable, Patent Airway and Pain level controlled  Last Vitals:  Filed Vitals:   06/19/14 0906  BP: 123/70  Pulse: 77  Temp:   Resp: 16    Post vital signs: Reviewed and stable  Level of consciousness: awake, alert  and oriented  Complications: No apparent anesthesia complications

## 2014-06-19 NOTE — Anesthesia Procedure Notes (Signed)
Procedure Name: LMA Insertion Date/Time: 06/19/2014 7:34 AM Performed by: Carmela RimaMARTINELLI, Kale Dols F Pre-anesthesia Checklist: Patient being monitored, Suction available, Emergency Drugs available, Timeout performed and Patient identified Patient Re-evaluated:Patient Re-evaluated prior to inductionOxygen Delivery Method: Circle system utilized Preoxygenation: Pre-oxygenation with 100% oxygen Intubation Type: IV induction Ventilation: Mask ventilation without difficulty LMA: LMA inserted LMA Size: 4.0 Tube type: Oral Number of attempts: 1 Placement Confirmation: positive ETCO2 and breath sounds checked- equal and bilateral Tube secured with: Tape Dental Injury: Teeth and Oropharynx as per pre-operative assessment

## 2014-06-19 NOTE — Interval H&P Note (Signed)
History and Physical Interval Note:  06/19/2014 7:21 AM  Melissa Sanchez  has presented today for surgery, with the diagnosis of LEFT BREAST MASS  The various methods of treatment have been discussed with the patient and family. After consideration of risks, benefits and other options for treatment, the patient has consented to  Procedure(s): LEFT BREAST RADIOACTIVE SEED GUIDED EXCISION (Left) as a surgical intervention .  The patient's history has been reviewed, patient examined, no change in status, stable for surgery.  I have reviewed the patient's chart and labs.  Questions were answered to the patient's satisfaction.     Jamill Wetmore

## 2014-06-19 NOTE — H&P (Signed)
22 yof who was undergoing six month f/u who was found to have numerous punctate calcs in the uoq of the left breast spanning 3 cm. there were additional calcs from six months and an area of distortion noted. US showed area of focal shadowing in the 230 oclock position 5 cm from nipple which is area of distortion on mm. this measures 10x8x7 mm in size. at the 1 oclock position there is benign appearing mass that is essentially unchanged. [place] of left axilla negative. this underwent stereo biopsy with finding of adh. She has no family history. Personal history of cyst aspiration. no mass or discharge.   Other Problems Fay Records, CMA; 05/22/2014 3:13 PM) High blood pressure Hypercholesterolemia Other disease, cancer, significant illness  Past Surgical History Fay Records, CMA; 05/22/2014 3:13 PM) Breast Biopsy Left.  Diagnostic Studies History Fay Records, [place]; 05/22/2014 3:13 PM) Colonoscopy 1-5 years ago Mammogram within last year Pap Smear 1-5 years ago  Allergies Fay Records, CMA; 05/22/2014 3:14 PM) No Known Drug Allergies03/22/2016  Medication History Fay Records, CMA; 05/22/2014 3:14 PM) Lisinopril (  Tablet, Oral) Active. Medications Reconciled  Social History Fay Records, [place]; 05/22/2014 3:13 PM) Alcohol use Occasional alcohol use. Caffeine use Coffee. Tobacco use Former smoker.  Family History Fay Records, [place]; 05/22/2014 3:13 PM) Colon Polyps Sister. Depression Mother. Diabetes Mellitus Mother. Heart Disease Mother. Hypertension Mother. Melanoma Family Members In General.  Pregnancy / Birth History Fay Records, CMA; 05/22/2014 3:13 PM) Age at menarche 10 years. Age of menopause 78-50 Gravida 0 Para 0  Review of Systems Fay Records CMA; 05/22/2014 3:13 PM) General Not Present- Appetite Loss, Chills, Fatigue, Fever, Night Sweats, Weight Gain and Weight Loss. Skin Not Present- Change in Wart/Mole, Dryness, Hives, Jaundice, New Lesions,  Non-Healing Wounds, Rash and Ulcer. HEENT Present- Seasonal Allergies. Not Present- Earache, Hearing Loss, Hoarseness, Nose Bleed, Oral Ulcers, Ringing in the Ears, Sinus Pain, Sore Throat, Visual Disturbances, Wears glasses/contact lenses and Yellow Eyes. Respiratory Present- Chronic Cough. Not Present- Bloody sputum, Difficulty Breathing, Snoring and Wheezing. Breast Not Present- Breast Mass, Breast Pain, Nipple Discharge and Skin Changes. Cardiovascular Present- Leg Cramps. Not Present- Chest Pain, Difficulty Breathing Lying Down, Palpitations, Rapid Heart Rate, Shortness of Breath and Swelling of Extremities. Gastrointestinal Not Present- Abdominal Pain, Bloating, Bloody Stool, Change in Bowel Habits, Chronic diarrhea, Constipation, Difficulty Swallowing, Excessive gas, Gets full quickly at meals, Hemorrhoids, Indigestion, Nausea, Rectal Pain and Vomiting. Female Genitourinary Not Present- Frequency, Nocturia, Painful Urination, Pelvic Pain and Urgency. Musculoskeletal Not Present- Back Pain, Joint Pain, Joint Stiffness, Muscle Pain, Muscle Weakness and Swelling of Extremities. Neurological Not Present- Decreased Memory, Fainting, Headaches, Numbness, Seizures, Tingling, Tremor, Trouble walking and Weakness. Psychiatric Present- Change in Sleep Pattern. Not Present- Anxiety, Bipolar, Depression, Fearful and Frequent crying. Endocrine Not Present- Cold Intolerance, Excessive Hunger, Hair Changes, Heat Intolerance, Hot flashes and New Diabetes. Hematology Not Present- Easy Bruising, Excessive bleeding, Gland problems, HIV and Persistent Infections.   Vitals Fay Records CMA; 05/22/2014 3:14 PM) 05/22/2014 3:14 PM Weight: 241 lb Height: 69.5in Body Surface Area: 2.32 m Body Mass Index: 35.08 kg/m Temp.: 97.75F(Temporal)  Pulse: 101 (Regular)  Resp.: 17 (Unlabored)  BP: 132/78 (Sitting, Left Arm, Standard)    Physical Exam Emelia Loron MD; 05/22/2014 3:41  PM) General Mental Status-Alert. Orientation-Oriented X3.  Chest and Lung Exam Chest and lung exam reveals -on auscultation, normal breath sounds, no adventitious sounds and normal vocal resonance.  Breast Nipples-No Discharge. Breast Lump-No Palpable Breast Mass.  Cardiovascular Cardiovascular examination reveals -normal  heart sounds, regular rate and rhythm with no murmurs.  Lymphatic Head & Neck  General Head & Neck Lymphatics: Bilateral - Description - Normal. Axillary  General Axillary Region: Bilateral - Description - Normal. Note: no Sidney adenopathy     Assessment & Plan Emelia Loron(Miosotis Wetsel MD; 05/22/2014 3:47 PM) BREAST CALCIFICATION, LEFT (793.89  R92.1) Story: Left breast calcifications, ADH  We discussed excision given upgrade on final path and she understands this could be dcis or invasive cancer. we also discussed she has a greater risk moving forward of breast cancer. we discussed radioactive seed guided excision of this area and risks/benefits/recovery and will proceed in a couple weeks when hematoma resolves a little more.

## 2014-06-19 NOTE — Transfer of Care (Signed)
Immediate Anesthesia Transfer of Care Note  Patient: Melissa Sanchez  Procedure(s) Performed: Procedure(s): LEFT BREAST RADIOACTIVE SEED GUIDED EXCISIONAL BIOPSY (Left)  Patient Location: PACU  Anesthesia Type:General  Level of Consciousness: awake, alert  and oriented  Airway & Oxygen Therapy: Patient Spontanous Breathing and Patient connected to nasal cannula oxygen  Post-op Assessment: Report given to RN, Post -op Vital signs reviewed and stable and Patient moving all extremities X 4  Post vital signs: Reviewed and stable  Last Vitals:  Filed Vitals:   06/19/14 0611  BP: 140/71  Pulse: 90  Temp: 36.7 C  Resp: 20    Complications: No apparent anesthesia complications

## 2014-06-19 NOTE — Anesthesia Preprocedure Evaluation (Addendum)
Anesthesia Evaluation  Patient identified by MRN, date of birth, ID band Patient awake    Reviewed: Allergy & Precautions, H&P , NPO status , Patient's Chart, lab work & pertinent test results  Airway Mallampati: II       Dental  (+) Teeth Intact, Dental Advidsory Given   Pulmonary former smoker,  breath sounds clear to auscultation        Cardiovascular hypertension, On Medications Rhythm:regular Rate:Normal     Neuro/Psych    GI/Hepatic   Endo/Other  obese  Renal/GU      Musculoskeletal   Abdominal   Peds  Hematology   Anesthesia Other Findings   Reproductive/Obstetrics                            Anesthesia Physical Anesthesia Plan  ASA: II  Anesthesia Plan: General   Post-op Pain Management:    Induction: Intravenous  Airway Management Planned: LMA  Additional Equipment:   Intra-op Plan:   Post-operative Plan:   Informed Consent: I have reviewed the patients History and Physical, chart, labs and discussed the procedure including the risks, benefits and alternatives for the proposed anesthesia with the patient or authorized representative who has indicated his/her understanding and acceptance.   Dental Advisory Given  Plan Discussed with: CRNA, Anesthesiologist and Surgeon  Anesthesia Plan Comments:        Anesthesia Quick Evaluation

## 2014-06-20 ENCOUNTER — Encounter (HOSPITAL_COMMUNITY): Payer: Self-pay | Admitting: General Surgery

## 2014-07-11 ENCOUNTER — Telehealth: Payer: Self-pay | Admitting: *Deleted

## 2014-07-11 NOTE — Telephone Encounter (Signed)
Received referral from [facility].  Called and left a message for the pt to return my call so I can schedule a med onc appt for High Risk.

## 2014-07-13 ENCOUNTER — Telehealth: Payer: Self-pay | Admitting: *Deleted

## 2014-07-13 NOTE — Telephone Encounter (Signed)
Spoke with patient to schedule appointment for high risk.  She states she checking with her insurance company and will call back for an appointment after she verifies with her insurance.

## 2014-07-24 ENCOUNTER — Telehealth: Payer: Self-pay | Admitting: *Deleted

## 2014-07-24 NOTE — Telephone Encounter (Signed)
Called pt w/ an update on if she is ready to schedule her High Risk appt and she states that she has not had a chance to call her insurance company and does not want to schedule until she calls them.  I emailed Shanda BumpsJessica and MendesAlisha at [facility] to make them aware.

## 2014-08-15 ENCOUNTER — Telehealth: Payer: Self-pay | Admitting: *Deleted

## 2014-08-15 NOTE — Telephone Encounter (Signed)
Pt called requesting the procedure code to provide to her insurance company to see if they will pay.  Called Tiffany and obtained the number.  Called pt and gave it to her and she will call her insurance company and then call me back.

## 2014-09-10 ENCOUNTER — Other Ambulatory Visit: Payer: Self-pay | Admitting: Obstetrics and Gynecology

## 2014-09-11 LAB — CYTOLOGY - PAP

## 2015-08-21 ENCOUNTER — Other Ambulatory Visit: Payer: Self-pay | Admitting: Obstetrics and Gynecology

## 2015-08-21 DIAGNOSIS — N644 Mastodynia: Secondary | ICD-10-CM

## 2015-08-28 ENCOUNTER — Ambulatory Visit
Admission: RE | Admit: 2015-08-28 | Discharge: 2015-08-28 | Disposition: A | Discharge status: Home / Self Care | Payer: BLUE CROSS/BLUE SHIELD | Source: Ambulatory Visit | Attending: Obstetrics and Gynecology | Admitting: Obstetrics and Gynecology

## 2015-08-28 DIAGNOSIS — N644 Mastodynia: Secondary | ICD-10-CM

## 2016-07-23 ENCOUNTER — Other Ambulatory Visit: Payer: Self-pay | Admitting: Obstetrics and Gynecology

## 2016-07-23 DIAGNOSIS — R922 Inconclusive mammogram: Secondary | ICD-10-CM

## 2016-09-15 ENCOUNTER — Ambulatory Visit
Admission: RE | Admit: 2016-09-15 | Discharge: 2016-09-15 | Disposition: A | Discharge status: Home / Self Care | Payer: 59 | Source: Ambulatory Visit | Attending: Obstetrics and Gynecology | Admitting: Obstetrics and Gynecology

## 2016-09-15 DIAGNOSIS — R922 Inconclusive mammogram: Secondary | ICD-10-CM

## 2017-12-07 ENCOUNTER — Encounter: Payer: Self-pay | Admitting: Skilled Nursing Facility1

## 2017-12-07 ENCOUNTER — Encounter: Payer: 59 | Attending: Family Medicine | Admitting: Skilled Nursing Facility1

## 2017-12-07 DIAGNOSIS — Z713 Dietary counseling and surveillance: Secondary | ICD-10-CM | POA: Insufficient documentation

## 2017-12-07 DIAGNOSIS — E1165 Type 2 diabetes mellitus with hyperglycemia: Secondary | ICD-10-CM | POA: Diagnosis not present

## 2017-12-07 DIAGNOSIS — E119 Type 2 diabetes mellitus without complications: Secondary | ICD-10-CM

## 2017-12-07 NOTE — Patient Instructions (Addendum)
-  Always bring your meter with you everywhere you go -Always Properly dispose of your needles:  -Discard in a hard plastic/metal container with a lid (something the needle can't puncture)  -Write Do Not Recycle on the outside of the container  -Example: A laundry detergent bottle -Never use the same needle more than once -Eat 2-3 carbohydrate choices for each meal and 1 for each snack -A meal: carbohydrates, protein, vegetable -A snack: A Fruit OR Vegetable AND Protein  -Try to be more active -Always pay attention to your body keeping watchful of possible low blood sugar (below 70) or high blood sugar (above 200)  -Check your feet every day looking for anything that was not there the day before   -Fast Clicks Drum for your mom  -Try to walk every day

## 2017-12-07 NOTE — Progress Notes (Signed)
Pt sates her mother and husband have diabetes and all live together.  Pt began to cry when insulin was mentioned as a possible treatment to uncontrolled diabetes. Pt states she works 10am-7pm.   Pt states she has psoriasis.  Pt states her feet really hurt and get worse at night.  Pt states she loves cheese. Pt states she knows what to do but she cannot stick with it long term due to emotional eating. Diabetes Self-Management Education  Visit Type: First/Initial  12/07/2017  Melissa Sanchez, identified by name and date of birth, is a 57 y.o. female with a diagnosis of Diabetes: Type 2.   ASSESSMENT  Height 5\' 9"  (1.753 m), weight 236 lb (107 kg). Body mass index is 34.85 kg/m.  Diabetes Self-Management Education - 12/07/17 0738      Visit Information   Visit Type  First/Initial      Initial Visit   Diabetes Type  Type 2    Are you currently following a meal plan?  No    Are you taking your medications as prescribed?  Yes      Health Coping   How would you rate your overall health?  Fair      Psychosocial Assessment   Patient Belief/Attitude about Diabetes  Motivated to manage diabetes      Pre-Education Assessment   Patient understands the diabetes disease and treatment process.  Needs Instruction    Patient understands incorporating nutritional management into lifestyle.  Needs Instruction    Patient undertands incorporating physical activity into lifestyle.  Needs Instruction    Patient understands using medications safely.  Needs Instruction    Patient understands monitoring blood glucose, interpreting and using results  Needs Instruction    Patient understands prevention, detection, and treatment of acute complications.  Needs Instruction    Patient understands prevention, detection, and treatment of chronic complications.  Needs Instruction    Patient understands how to develop strategies to address psychosocial issues.  Needs Instruction    Patient understands how  to develop strategies to promote health/change behavior.  Needs Instruction      Complications   Last HgB A1C per patient/outside source  8.9 %    How often do you check your blood sugar?  0 times/day (not testing)    Number of hypoglycemic episodes per month  0    Have you had a dilated eye exam in the past 12 months?  No    Have you had a dental exam in the past 12 months?  Yes    Are you checking your feet?  Yes    How many days per week are you checking your feet?  7      Dietary Intake   Breakfast  english muffin with almond butter ot 2 eggs with english muffin     Snack (morning)  cheese    Lunch  deli sandwich or chicken salad or sandwich and french fries and eating cheese while making her sandwich or fast food    Snack (afternoon)  cheese    Dinner  spinach macaroni and beef or fast food    Beverage(s)  regular coffee, seltzer somtimes with added fruit, beer sometimes, water       Exercise   Exercise Type  ADL's    How many days per week to you exercise?  0    How many minutes per day do you exercise?  0    Total minutes per week of exercise  0  Patient Education   Previous Diabetes Education  No    Disease state   Definition of diabetes, type 1 and 2, and the diagnosis of diabetes;Factors that contribute to the development of diabetes;Explored patient's options for treatment of their diabetes    Nutrition management   Food label reading, portion sizes and measuring food.;Role of diet in the treatment of diabetes and the relationship between the three main macronutrients and blood glucose level;Meal timing in regards to the patients' current diabetes medication.;Carbohydrate counting;Reviewed blood glucose goals for pre and post meals and how to evaluate the patients' food intake on their blood glucose level.    Physical activity and exercise   Role of exercise on diabetes management, blood pressure control and cardiac health.;Identified with patient nutritional and/or  medication changes necessary with exercise.;Helped patient identify appropriate exercises in relation to his/her diabetes, diabetes complications and other health issue.    Medications  Reviewed patients medication for diabetes, action, purpose, timing of dose and side effects.    Monitoring  Taught/evaluated SMBG meter.;Purpose and frequency of SMBG.;Yearly dilated eye exam;Daily foot exams;Identified appropriate SMBG and/or A1C goals.    Acute complications  Taught treatment of hypoglycemia - the 15 rule.;Discussed and identified patients' treatment of hyperglycemia.    Chronic complications  Assessed and discussed foot care and prevention of foot problems;Dental care;Lipid levels, blood glucose control and heart disease;Retinopathy and reason for yearly dilated eye exams;Reviewed with patient heart disease, higher risk of, and prevention    Psychosocial adjustment  Worked with patient to identify barriers to care and solutions;Role of stress on diabetes      Individualized Goals (developed by patient)   Nutrition  Follow meal plan discussed;General guidelines for healthy choices and portions discussed;Adjust meds/carbs with exercise as discussed    Physical Activity  30 minutes per day;Exercise 5-7 days per week    Medications  take my medication as prescribed    Monitoring   test my blood glucose as discussed;test blood glucose pre and post meals as discussed      Post-Education Assessment   Patient understands the diabetes disease and treatment process.  Demonstrates understanding / competency    Patient understands incorporating nutritional management into lifestyle.  Demonstrates understanding / competency    Patient undertands incorporating physical activity into lifestyle.  Demonstrates understanding / competency    Patient understands using medications safely.  Demonstrates understanding / competency    Patient understands monitoring blood glucose, interpreting and using results   Demonstrates understanding / competency    Patient understands prevention, detection, and treatment of acute complications.  Demonstrates understanding / competency    Patient understands prevention, detection, and treatment of chronic complications.  Demonstrates understanding / competency    Patient understands how to develop strategies to address psychosocial issues.  Demonstrates understanding / competency    Patient understands how to develop strategies to promote health/change behavior.  Demonstrates understanding / competency      Outcomes   Expected Outcomes  Demonstrated interest in learning. Expect positive outcomes    Future DMSE  PRN    Program Status  Completed       Individualized Plan for Diabetes Self-Management Training:   Learning Objective:  Patient will have a greater understanding of diabetes self-management. Patient education plan is to attend individual and/or group sessions per assessed needs and concerns.   Plan:   Patient Instructions  -Always bring your meter with you everywhere you go -Always Properly dispose of your needles:  -Discard in a  hard plastic/metal container with a lid (something the needle can't puncture)  -Write Do Not Recycle on the outside of the container  -Example: A laundry detergent bottle -Never use the same needle more than once -Eat 2-3 carbohydrate choices for each meal and 1 for each snack -A meal: carbohydrates, protein, vegetable -A snack: A Fruit OR Vegetable AND Protein  -Try to be more active -Always pay attention to your body keeping watchful of possible low blood sugar (below 70) or high blood sugar (above 200)  -Check your feet every day looking for anything that was not there the day before   -Fast Clicks Drum for your mom  -Try to walk every day   Expected Outcomes:  Demonstrated interest in learning. Expect positive outcomes  Education material provided: ADA Diabetes: Your Take Control Guide, Meal plan card and  My Plate  If problems or questions, patient to contact team via:  Phone  Future DSME appointment: PRN

## 2018-01-26 ENCOUNTER — Ambulatory Visit: Payer: 59 | Admitting: Skilled Nursing Facility1

## 2018-01-31 DIAGNOSIS — Z7984 Long term (current) use of oral hypoglycemic drugs: Secondary | ICD-10-CM | POA: Diagnosis not present

## 2018-02-16 ENCOUNTER — Ambulatory Visit: Payer: 59 | Admitting: Skilled Nursing Facility1

## 2018-03-09 IMAGING — MG 2D DIGITAL DIAGNOSTIC BILATERAL MAMMOGRAM WITH CAD AND ADJUNCT T
8 of 12 series · 8 of 28 positions shown · non-contrast
Comparison: Previous exam(s).

CLINICAL DATA: 55-year-old female status post left lumpectomy in
June 2014 demonstrating lobular carcinoma in situ and atypical
ductal hyperplasia. Routine exam.

EXAM:
2D DIGITAL DIAGNOSTIC BILATERAL MAMMOGRAM WITH CAD AND ADJUNCT TOMO

[R MLO synth-2D]
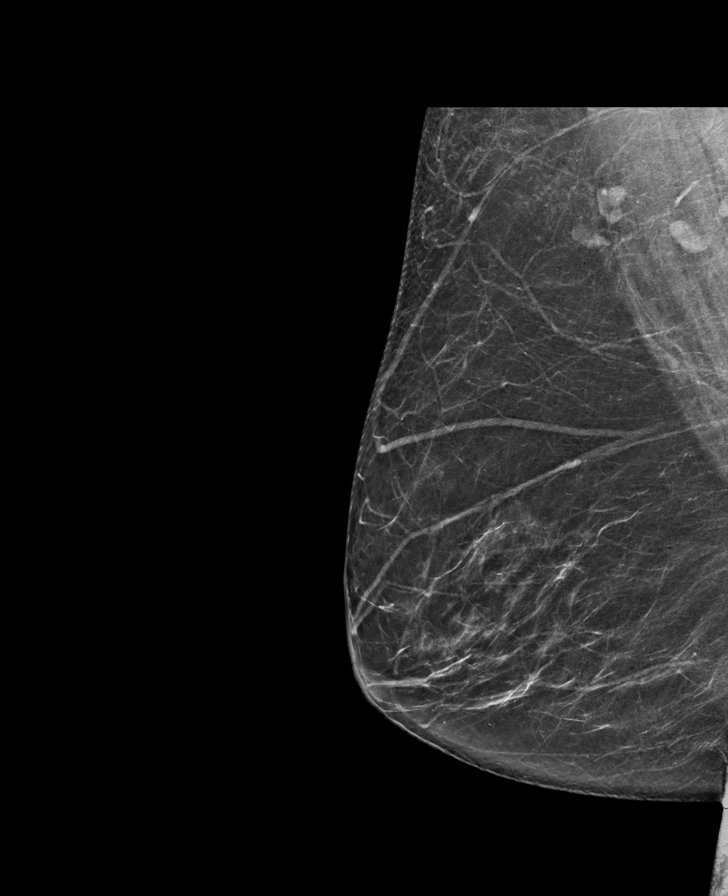

[R CC]
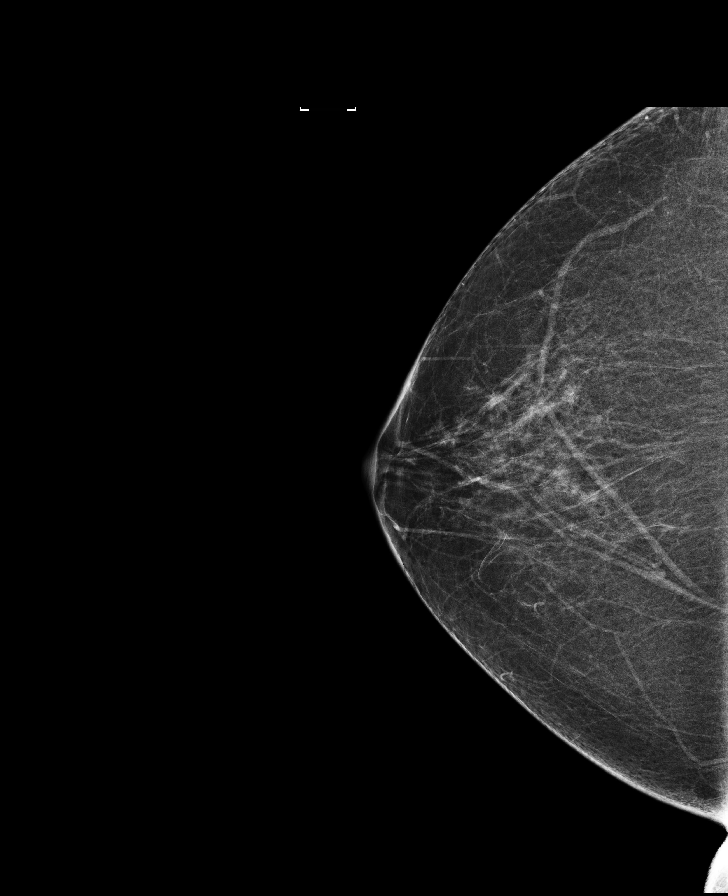

[R MLO]
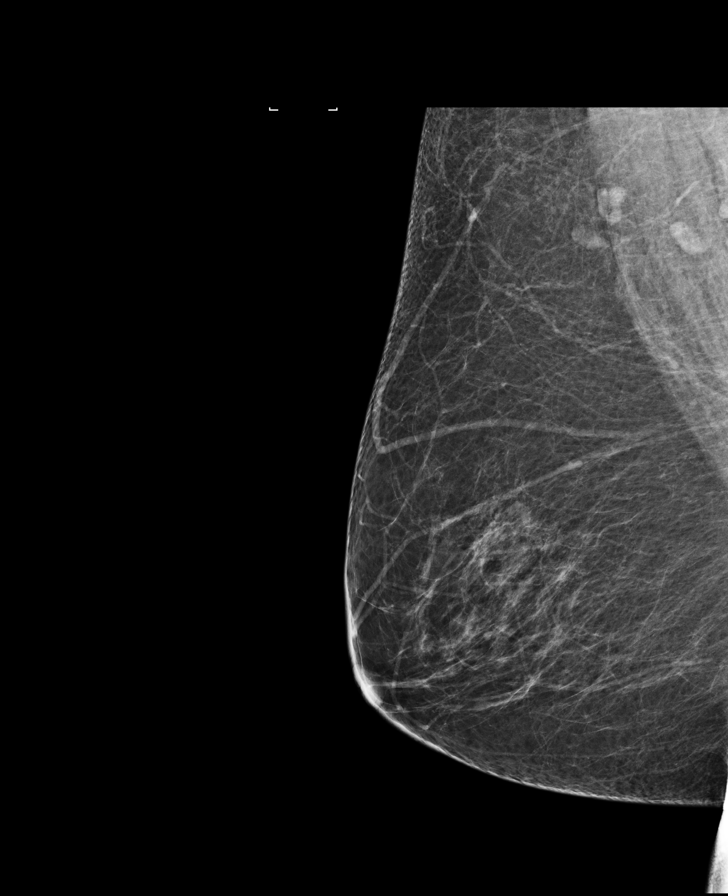

[R CC synth-2D]
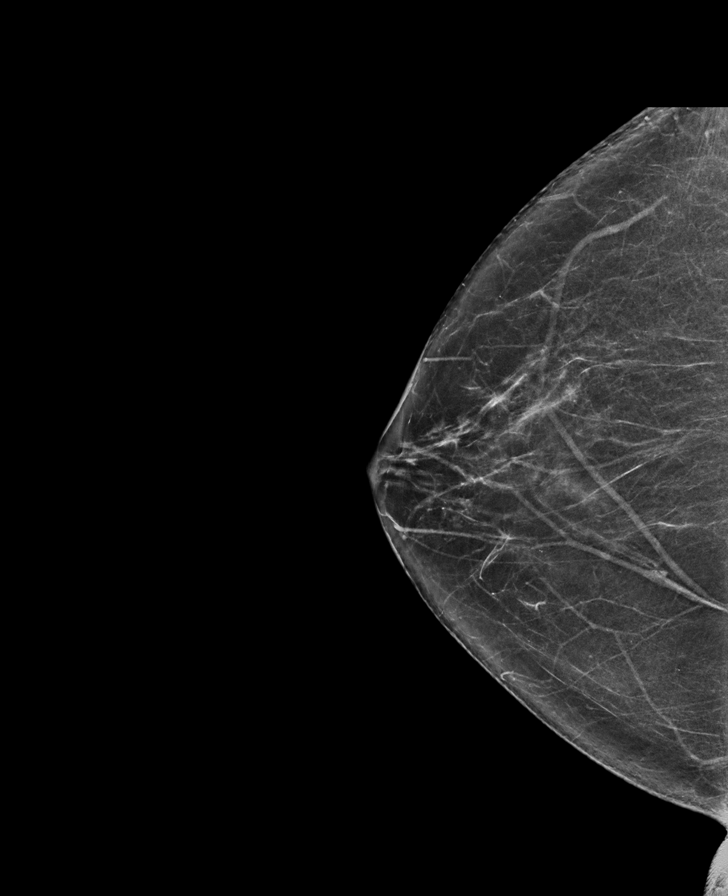

[L MLO synth-2D]
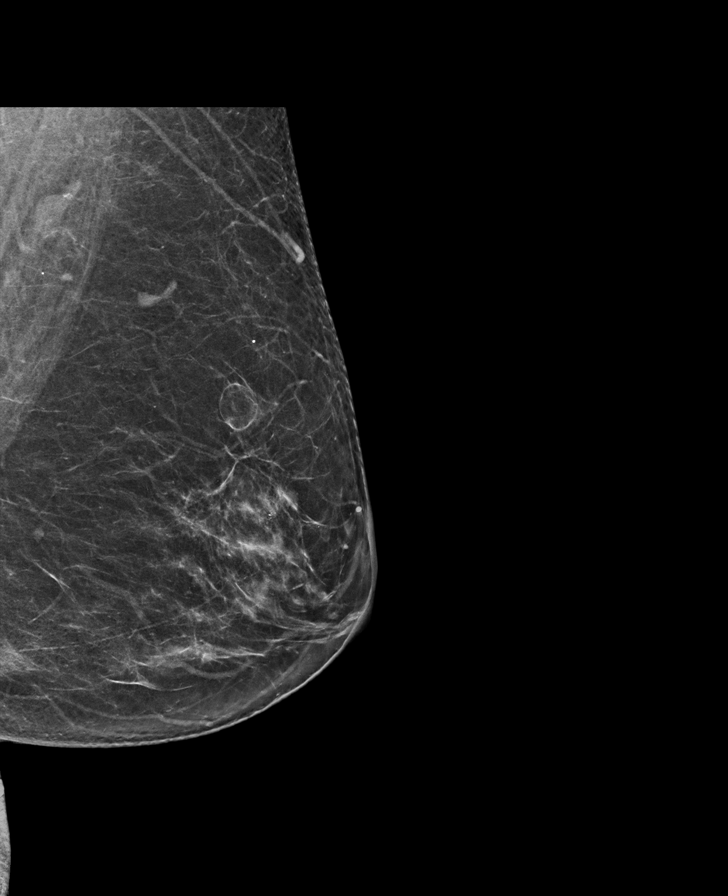

[L CC]
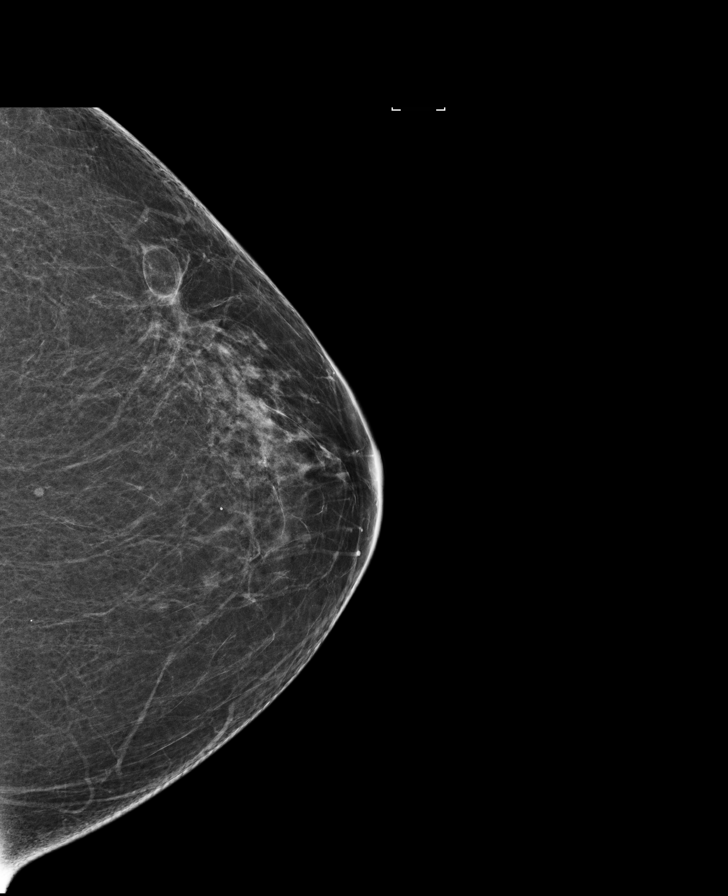

[L MLO]
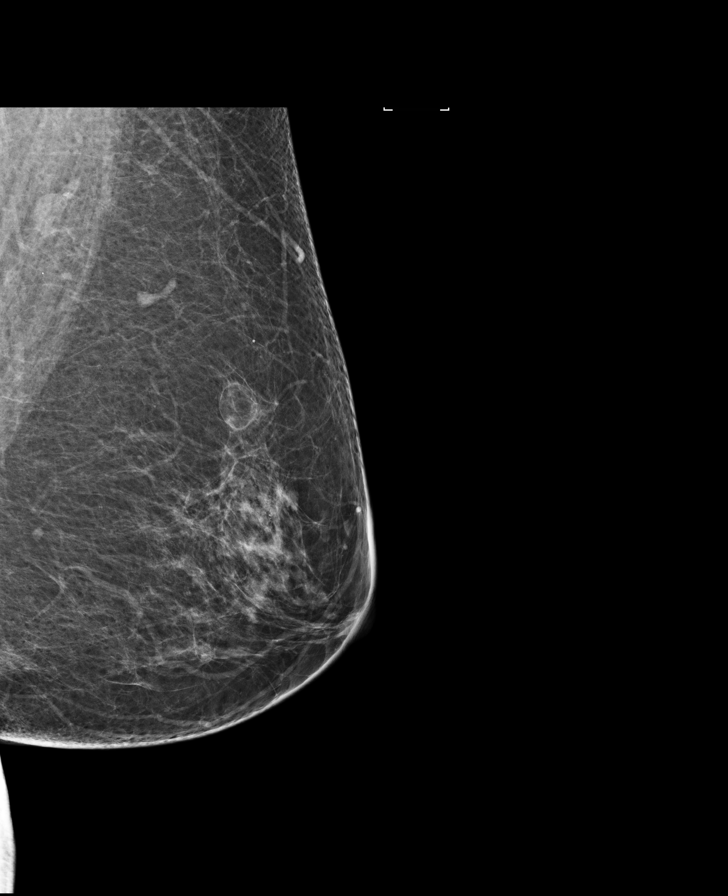

[L CC synth-2D]
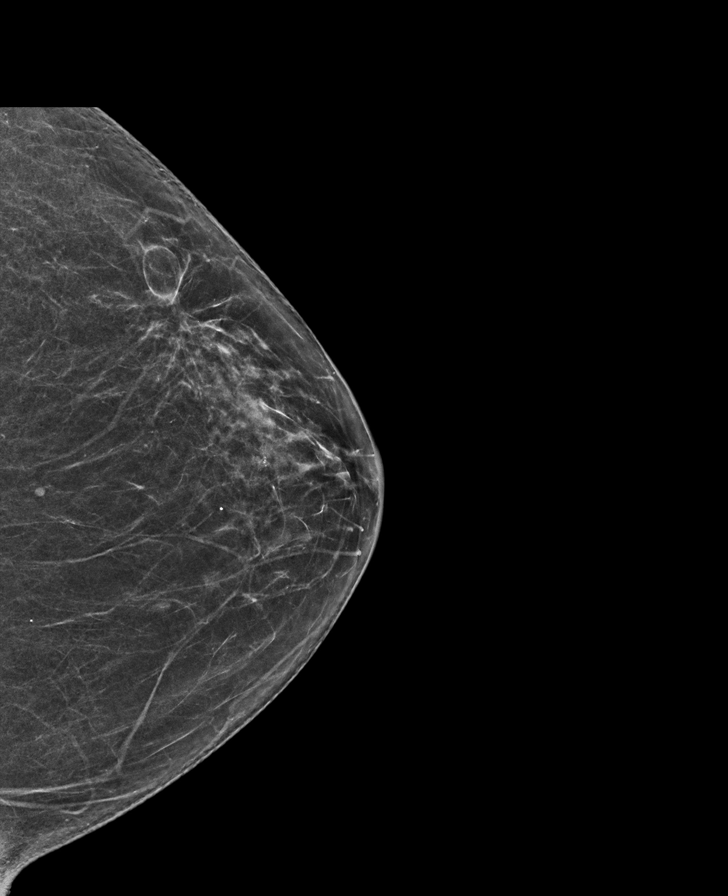

[8 of 28 positions shown; findings below may reference images not displayed]

ACR Breast Density Category b: There are scattered areas of
fibroglandular density.
FINDINGS: Excisional biopsy changes are again seen of the upper, outer left
breast including a 1.5 cm oil cyst. No suspicious mass,
calcifications, or other abnormality is identified within either
breast.

Mammographic images were processed with CAD.
IMPRESSION: No mammographic evidence of malignancy.

RECOMMENDATION:
Screening mammogram in one year.(Code:[id number])

I have discussed the findings and recommendations with the patient.
Results were also provided in writing at the conclusion of the
visit. If applicable, a reminder letter will be sent to the patient
regarding the next appointment.

BI-RADS CATEGORY  2: Benign.

## 2018-04-25 DIAGNOSIS — K219 Gastro-esophageal reflux disease without esophagitis: Secondary | ICD-10-CM | POA: Diagnosis not present

## 2018-04-25 DIAGNOSIS — E039 Hypothyroidism, unspecified: Secondary | ICD-10-CM | POA: Diagnosis not present

## 2018-04-25 DIAGNOSIS — E782 Mixed hyperlipidemia: Secondary | ICD-10-CM | POA: Diagnosis not present

## 2018-04-25 DIAGNOSIS — E559 Vitamin D deficiency, unspecified: Secondary | ICD-10-CM | POA: Diagnosis not present

## 2018-04-25 DIAGNOSIS — I10 Essential (primary) hypertension: Secondary | ICD-10-CM | POA: Diagnosis not present

## 2018-04-25 DIAGNOSIS — E1165 Type 2 diabetes mellitus with hyperglycemia: Secondary | ICD-10-CM | POA: Diagnosis not present

## 2018-04-25 DIAGNOSIS — Z0000 Encounter for general adult medical examination without abnormal findings: Secondary | ICD-10-CM | POA: Diagnosis not present

## 2018-06-23 DIAGNOSIS — E1165 Type 2 diabetes mellitus with hyperglycemia: Secondary | ICD-10-CM | POA: Diagnosis not present

## 2018-07-27 DIAGNOSIS — E1165 Type 2 diabetes mellitus with hyperglycemia: Secondary | ICD-10-CM | POA: Diagnosis not present

## 2018-07-27 DIAGNOSIS — E7800 Pure hypercholesterolemia, unspecified: Secondary | ICD-10-CM | POA: Diagnosis not present

## 2018-07-27 DIAGNOSIS — Z8249 Family history of ischemic heart disease and other diseases of the circulatory system: Secondary | ICD-10-CM | POA: Diagnosis not present

## 2018-07-27 DIAGNOSIS — Z6835 Body mass index (BMI) 35.0-35.9, adult: Secondary | ICD-10-CM | POA: Diagnosis not present

## 2018-10-20 DIAGNOSIS — Z6836 Body mass index (BMI) 36.0-36.9, adult: Secondary | ICD-10-CM | POA: Diagnosis not present

## 2018-10-20 DIAGNOSIS — Z13820 Encounter for screening for osteoporosis: Secondary | ICD-10-CM | POA: Diagnosis not present

## 2018-10-20 DIAGNOSIS — Z01419 Encounter for gynecological examination (general) (routine) without abnormal findings: Secondary | ICD-10-CM | POA: Diagnosis not present

## 2018-10-20 DIAGNOSIS — Z1231 Encounter for screening mammogram for malignant neoplasm of breast: Secondary | ICD-10-CM | POA: Diagnosis not present

## 2018-10-24 DIAGNOSIS — I10 Essential (primary) hypertension: Secondary | ICD-10-CM | POA: Diagnosis not present

## 2018-10-24 DIAGNOSIS — E039 Hypothyroidism, unspecified: Secondary | ICD-10-CM | POA: Diagnosis not present

## 2018-10-24 DIAGNOSIS — E782 Mixed hyperlipidemia: Secondary | ICD-10-CM | POA: Diagnosis not present

## 2018-10-24 DIAGNOSIS — Z6835 Body mass index (BMI) 35.0-35.9, adult: Secondary | ICD-10-CM | POA: Diagnosis not present

## 2018-10-24 DIAGNOSIS — E7800 Pure hypercholesterolemia, unspecified: Secondary | ICD-10-CM | POA: Diagnosis not present

## 2018-10-24 DIAGNOSIS — E1165 Type 2 diabetes mellitus with hyperglycemia: Secondary | ICD-10-CM | POA: Diagnosis not present

## 2018-10-28 DIAGNOSIS — E559 Vitamin D deficiency, unspecified: Secondary | ICD-10-CM | POA: Diagnosis not present

## 2018-10-28 DIAGNOSIS — E1165 Type 2 diabetes mellitus with hyperglycemia: Secondary | ICD-10-CM | POA: Diagnosis not present

## 2018-10-28 DIAGNOSIS — E782 Mixed hyperlipidemia: Secondary | ICD-10-CM | POA: Diagnosis not present

## 2018-10-28 DIAGNOSIS — I10 Essential (primary) hypertension: Secondary | ICD-10-CM | POA: Diagnosis not present

## 2019-05-03 DIAGNOSIS — S61217A Laceration without foreign body of left little finger without damage to nail, initial encounter: Secondary | ICD-10-CM | POA: Diagnosis not present

## 2019-05-13 DIAGNOSIS — Z4802 Encounter for removal of sutures: Secondary | ICD-10-CM | POA: Diagnosis not present

## 2019-05-13 DIAGNOSIS — S61217D Laceration without foreign body of left little finger without damage to nail, subsequent encounter: Secondary | ICD-10-CM | POA: Diagnosis not present

## 2019-07-19 DIAGNOSIS — E1165 Type 2 diabetes mellitus with hyperglycemia: Secondary | ICD-10-CM | POA: Diagnosis not present

## 2019-07-19 DIAGNOSIS — E782 Mixed hyperlipidemia: Secondary | ICD-10-CM | POA: Diagnosis not present

## 2019-07-19 DIAGNOSIS — E559 Vitamin D deficiency, unspecified: Secondary | ICD-10-CM | POA: Diagnosis not present

## 2019-07-19 DIAGNOSIS — E039 Hypothyroidism, unspecified: Secondary | ICD-10-CM | POA: Diagnosis not present

## 2019-07-21 DIAGNOSIS — E1165 Type 2 diabetes mellitus with hyperglycemia: Secondary | ICD-10-CM | POA: Diagnosis not present

## 2019-07-21 DIAGNOSIS — E559 Vitamin D deficiency, unspecified: Secondary | ICD-10-CM | POA: Diagnosis not present

## 2019-07-21 DIAGNOSIS — Z0000 Encounter for general adult medical examination without abnormal findings: Secondary | ICD-10-CM | POA: Diagnosis not present

## 2019-07-21 DIAGNOSIS — I10 Essential (primary) hypertension: Secondary | ICD-10-CM | POA: Diagnosis not present

## 2019-07-21 DIAGNOSIS — K219 Gastro-esophageal reflux disease without esophagitis: Secondary | ICD-10-CM | POA: Diagnosis not present

## 2019-07-21 DIAGNOSIS — E782 Mixed hyperlipidemia: Secondary | ICD-10-CM | POA: Diagnosis not present

## 2019-07-26 ENCOUNTER — Encounter: Payer: Self-pay | Admitting: Neurology

## 2019-10-23 DIAGNOSIS — Z1231 Encounter for screening mammogram for malignant neoplasm of breast: Secondary | ICD-10-CM | POA: Diagnosis not present

## 2019-10-23 DIAGNOSIS — Z01419 Encounter for gynecological examination (general) (routine) without abnormal findings: Secondary | ICD-10-CM | POA: Diagnosis not present

## 2019-10-23 DIAGNOSIS — Z6835 Body mass index (BMI) 35.0-35.9, adult: Secondary | ICD-10-CM | POA: Diagnosis not present

## 2019-10-25 DIAGNOSIS — L719 Rosacea, unspecified: Secondary | ICD-10-CM | POA: Diagnosis not present

## 2019-10-25 DIAGNOSIS — L578 Other skin changes due to chronic exposure to nonionizing radiation: Secondary | ICD-10-CM | POA: Diagnosis not present

## 2019-10-25 DIAGNOSIS — L570 Actinic keratosis: Secondary | ICD-10-CM | POA: Diagnosis not present

## 2019-10-25 DIAGNOSIS — L409 Psoriasis, unspecified: Secondary | ICD-10-CM | POA: Diagnosis not present

## 2019-10-25 DIAGNOSIS — D225 Melanocytic nevi of trunk: Secondary | ICD-10-CM | POA: Diagnosis not present

## 2019-11-09 ENCOUNTER — Ambulatory Visit: Payer: BC Managed Care – PPO | Admitting: Neurology

## 2019-11-09 ENCOUNTER — Encounter: Payer: Self-pay | Admitting: Neurology

## 2019-11-09 ENCOUNTER — Other Ambulatory Visit: Payer: Self-pay

## 2019-11-09 VITALS — BP 118/79 | HR 91 | Ht 69.0 in | Wt 245.0 lb

## 2019-11-09 DIAGNOSIS — E1142 Type 2 diabetes mellitus with diabetic polyneuropathy: Secondary | ICD-10-CM

## 2019-11-09 NOTE — Patient Instructions (Addendum)
Please work on getting your diabetes under better control to prevent progression of neuropathy  Check your feet daily  You can use Aspercream with lidocaine for nighttime pain  If symptoms get worse, come back and see me

## 2019-11-09 NOTE — Progress Notes (Signed)
[facility] Clinic Note - Initial Visit   Date: 11/09/19  [name] MRN: [id number] DOB: [birth year]   Dear Dr. Zachery Dauer:  Thank you for your kind referral of Melissa Sanchez for consultation of bilateral feet paresthesias. Although her history is well known to you, please allow [place] to reiterate it for the purpose of our medical record. The patient was accompanied to the clinic by self.     History of Present Illness: Melissa Sanchez is a 59 y.o. right-handed female with poorly controlled diabetes mellitus, hypertension, hyperlipidemia, and psoriasis presenting for evaluation of bilateral feet paresthesias.   Starting around ~2016, she began having numbness and tingling in the toes and has extended into the mid-foot into her toes. Symptoms are worse at nighttime. She denies weakness.  No imbalance or falls.  Her diabetes is poorly controlled with last HbA1c 9.4, which she attributes to poor lifestyle choices.  No history of alcohol use. Her mother also has neuropathy.   Out-side paper records, electronic medical record, and images have been reviewed where available and summarized as:  Labs 07/19/2019:   HbA1c 9.4*, TSH 2.46  Past Medical History:  Diagnosis Date  . Diabetes mellitus without complication (HCC)   . Endometriosis   . Hypertension   . Psoriasis     Past Surgical History:  Procedure Laterality Date  . BREAST CYST ASPIRATION Left 1995  . BREAST LUMPECTOMY WITH RADIOACTIVE SEED LOCALIZATION Left 06/19/2014   Procedure: LEFT BREAST RADIOACTIVE SEED GUIDED EXCISIONAL BIOPSY;  Surgeon: Emelia Loron, MD;  Location: [facility] OR;  Service: General;  Laterality: Left;  . ENDOMETRIAL ABLATION    . LIPOMA EXCISION     left shoulder  . pelvic exploration       Medications:  Outpatient Encounter Medications as of 11/09/2019  Medication Sig  . atorvastatin (LIPITOR) 40 MG tablet Take 40 mg by mouth daily. One tablet daily  . Cholecalciferol  (VITAMIN D) 2000 UNITS tablet Take 2,000 Units by mouth daily.  Marland Kitchen lisinopril (PRINIVIL,ZESTRIL) 40 MG tablet Take 40 mg by mouth daily.  . metFORMIN (GLUCOPHAGE) 500 MG tablet Take 500 mg by mouth 2 (two) times daily with a meal.  . calcipotriene-betamethasone (TACLONEX) ointment Apply 1 application topically as needed. (Patient not taking: Reported on 11/09/2019)  . Clobetasol Prop Emollient Base (CLOBETASOL PROPIONATE E EX) Apply 1 application topically as needed. (Patient not taking: Reported on 11/09/2019)  . OVER THE COUNTER MEDICATION Apply 1 application topically daily as needed (Vitamin E ointment). (Patient not taking: Reported on 11/09/2019)   No facility-administered encounter medications on file as of 11/09/2019.    Allergies: No Known Allergies  Family History: Family History  Problem Relation Age of Onset  . Diabetes Mellitus II Mother   . Hyperlipidemia Mother   . Renal Disease Father   . Hypertension Sister     Social History: Social History   Tobacco Use  . Smoking status: Former Smoker    Packs/day: 1.00    Years: 25.00    Pack years: 25.00    Types: Cigarettes    Quit date: 03/02/2005    Years since quitting: 14.6  . Smokeless tobacco: Never Used  Vaping Use  . Vaping Use: Never used  Substance Use Topics  . Alcohol use: Yes    Comment: rare/ 5 drinks a year  . Drug use: No   Social History   Social History Narrative   Right Handed   Drinks Caffeine - Black Coffee  One story with stairs that lead to the garage.    QA analysis at [facility]    Vital Signs:  BP 118/79   Pulse 91   Ht 5\' 9"  (1.753 m)   Wt 245 lb (111.1 kg)   SpO2 95%   BMI 36.18 kg/m    Neurological Exam: MENTAL STATUS including orientation to time, place, person, recent and remote memory, attention span and concentration, language, and fund of knowledge is normal.  Speech is not dysarthric.  CRANIAL NERVES: II:  No visual field defects.     III-IV-VI: Pupils equal round and  reactive.  Normal conjugate, extra-ocular eye movements in all directions of gaze.  No nystagmus.  No ptosis.   V:  Normal facial sensation.    VII:  Normal facial symmetry and movements.   VIII:  Normal hearing and vestibular function.   IX-X:  Normal palatal movement.   XI:  Normal shoulder shrug and head rotation.   XII:  Normal tongue strength and range of motion, no deviation or fasciculation.  MOTOR:  No atrophy, fasciculations or abnormal movements.  No pronator drift.   Upper Extremity:  Right  Left  Deltoid  5/5   5/5   Biceps  5/5   5/5   Triceps  5/5   5/5   Infraspinatus 5/5  5/5  Medial pectoralis 5/5  5/5  Wrist extensors  5/5   5/5   Wrist flexors  5/5   5/5   Finger extensors  5/5   5/5   Finger flexors  5/5   5/5   Dorsal interossei  5/5   5/5   Abductor pollicis  5/5   5/5   Tone (Ashworth scale)  0  0   Lower Extremity:  Right  Left  Hip flexors  5/5   5/5   Hip extensors  5/5   5/5   Adductor 5/5  5/5  Abductor 5/5  5/5  Knee flexors  5/5   5/5   Knee extensors  5/5   5/5   Dorsiflexors  5/5   5/5   Plantarflexors  5/5   5/5   Toe extensors  5/5   5/5   Toe flexors  5/5   5/5   Tone (Ashworth scale)  0  0   MSRs:  Right        Left                  brachioradialis 2+  2+  biceps 2+  2+  triceps 2+  2+  patellar 2+  2+  ankle jerk 0  0  Hoffman no  no  plantar response down  down   SENSORY:  Absent vibration at the great toe, reduced at the ankles and intact at the knees.  Temperature and pin prick is intact throughout.  Rhomberg testing shows very mild sway.  COORDINATION/GAIT: Normal finger-to- nose-finger.  Intact rapid alternating movements bilaterally.  Able to rise from a chair without using arms.  Gait narrow based and stable. Tandem and stressed gait intact.    IMPRESSION: Diabetic peripheral neuropathy manifesting with distal numbness/tingling in the feet.  Patient educated on the nature of neuropathy, management options, and importance  of trying to keep diabetes under good control to prevent progression.  Unfortunately, there is no treatment for numbness and medications are only used for painful paresthesias, which is not a prominent feature for her.  She may use lidocaine ointment at nighttime as needed.  I  offered EDX testing, but given her classic history and exam, it was declined.  If symptoms progress, this can be readdressed. Patient educated on daily foot inspection, fall prevention, and safety precautions around the home.  Return to clinic as needed   Thank you for allowing me to participate in patient's care.  If I can answer any additional questions, I would be pleased to do so.    Sincerely,    Clea Dubach K. Allena Katz, DO

## 2019-12-11 DIAGNOSIS — E119 Type 2 diabetes mellitus without complications: Secondary | ICD-10-CM | POA: Diagnosis not present

## 2020-01-16 DIAGNOSIS — E039 Hypothyroidism, unspecified: Secondary | ICD-10-CM | POA: Diagnosis not present

## 2020-01-16 DIAGNOSIS — I10 Essential (primary) hypertension: Secondary | ICD-10-CM | POA: Diagnosis not present

## 2020-01-16 DIAGNOSIS — E1165 Type 2 diabetes mellitus with hyperglycemia: Secondary | ICD-10-CM | POA: Diagnosis not present

## 2020-01-16 DIAGNOSIS — E559 Vitamin D deficiency, unspecified: Secondary | ICD-10-CM | POA: Diagnosis not present

## 2020-05-31 DIAGNOSIS — R197 Diarrhea, unspecified: Secondary | ICD-10-CM | POA: Diagnosis not present

## 2020-05-31 DIAGNOSIS — K921 Melena: Secondary | ICD-10-CM | POA: Diagnosis not present

## 2020-06-24 DIAGNOSIS — E7800 Pure hypercholesterolemia, unspecified: Secondary | ICD-10-CM | POA: Diagnosis not present

## 2020-06-24 DIAGNOSIS — E1165 Type 2 diabetes mellitus with hyperglycemia: Secondary | ICD-10-CM | POA: Diagnosis not present

## 2020-06-24 DIAGNOSIS — Z23 Encounter for immunization: Secondary | ICD-10-CM | POA: Diagnosis not present

## 2020-06-24 DIAGNOSIS — I10 Essential (primary) hypertension: Secondary | ICD-10-CM | POA: Diagnosis not present

## 2020-09-24 DIAGNOSIS — R350 Frequency of micturition: Secondary | ICD-10-CM | POA: Diagnosis not present

## 2020-09-24 DIAGNOSIS — E1165 Type 2 diabetes mellitus with hyperglycemia: Secondary | ICD-10-CM | POA: Diagnosis not present

## 2020-09-24 DIAGNOSIS — E782 Mixed hyperlipidemia: Secondary | ICD-10-CM | POA: Diagnosis not present

## 2020-09-24 DIAGNOSIS — I10 Essential (primary) hypertension: Secondary | ICD-10-CM | POA: Diagnosis not present

## 2020-10-23 DIAGNOSIS — Z01419 Encounter for gynecological examination (general) (routine) without abnormal findings: Secondary | ICD-10-CM | POA: Diagnosis not present

## 2020-10-23 DIAGNOSIS — Z1231 Encounter for screening mammogram for malignant neoplasm of breast: Secondary | ICD-10-CM | POA: Diagnosis not present

## 2020-10-23 DIAGNOSIS — Z6836 Body mass index (BMI) 36.0-36.9, adult: Secondary | ICD-10-CM | POA: Diagnosis not present

## 2020-10-28 DIAGNOSIS — L918 Other hypertrophic disorders of the skin: Secondary | ICD-10-CM | POA: Diagnosis not present

## 2020-10-28 DIAGNOSIS — L821 Other seborrheic keratosis: Secondary | ICD-10-CM | POA: Diagnosis not present

## 2020-10-28 DIAGNOSIS — D225 Melanocytic nevi of trunk: Secondary | ICD-10-CM | POA: Diagnosis not present

## 2020-10-28 DIAGNOSIS — L570 Actinic keratosis: Secondary | ICD-10-CM | POA: Diagnosis not present

## 2020-10-28 DIAGNOSIS — L578 Other skin changes due to chronic exposure to nonionizing radiation: Secondary | ICD-10-CM | POA: Diagnosis not present

## 2020-12-05 DIAGNOSIS — D125 Benign neoplasm of sigmoid colon: Secondary | ICD-10-CM | POA: Diagnosis not present

## 2020-12-05 DIAGNOSIS — Z1211 Encounter for screening for malignant neoplasm of colon: Secondary | ICD-10-CM | POA: Diagnosis not present

## 2020-12-05 DIAGNOSIS — K649 Unspecified hemorrhoids: Secondary | ICD-10-CM | POA: Diagnosis not present

## 2020-12-05 DIAGNOSIS — K5730 Diverticulosis of large intestine without perforation or abscess without bleeding: Secondary | ICD-10-CM | POA: Diagnosis not present

## 2020-12-16 DIAGNOSIS — H2513 Age-related nuclear cataract, bilateral: Secondary | ICD-10-CM | POA: Diagnosis not present

## 2020-12-16 DIAGNOSIS — E119 Type 2 diabetes mellitus without complications: Secondary | ICD-10-CM | POA: Diagnosis not present

## 2020-12-24 DIAGNOSIS — Z23 Encounter for immunization: Secondary | ICD-10-CM | POA: Diagnosis not present

## 2020-12-24 DIAGNOSIS — E1165 Type 2 diabetes mellitus with hyperglycemia: Secondary | ICD-10-CM | POA: Diagnosis not present

## 2021-02-14 DIAGNOSIS — R051 Acute cough: Secondary | ICD-10-CM | POA: Diagnosis not present

## 2021-02-14 DIAGNOSIS — R52 Pain, unspecified: Secondary | ICD-10-CM | POA: Diagnosis not present

## 2021-02-14 DIAGNOSIS — Z20828 Contact with and (suspected) exposure to other viral communicable diseases: Secondary | ICD-10-CM | POA: Diagnosis not present

## 2021-05-14 DIAGNOSIS — M79671 Pain in right foot: Secondary | ICD-10-CM | POA: Diagnosis not present

## 2021-05-14 DIAGNOSIS — M79672 Pain in left foot: Secondary | ICD-10-CM | POA: Diagnosis not present

## 2021-05-14 DIAGNOSIS — I739 Peripheral vascular disease, unspecified: Secondary | ICD-10-CM | POA: Diagnosis not present

## 2021-05-14 DIAGNOSIS — L603 Nail dystrophy: Secondary | ICD-10-CM | POA: Diagnosis not present

## 2021-05-14 DIAGNOSIS — E1142 Type 2 diabetes mellitus with diabetic polyneuropathy: Secondary | ICD-10-CM | POA: Diagnosis not present

## 2021-06-05 DIAGNOSIS — M25511 Pain in right shoulder: Secondary | ICD-10-CM | POA: Diagnosis not present

## 2021-07-01 DIAGNOSIS — I10 Essential (primary) hypertension: Secondary | ICD-10-CM | POA: Diagnosis not present

## 2021-07-01 DIAGNOSIS — E7800 Pure hypercholesterolemia, unspecified: Secondary | ICD-10-CM | POA: Diagnosis not present

## 2021-07-01 DIAGNOSIS — E1165 Type 2 diabetes mellitus with hyperglycemia: Secondary | ICD-10-CM | POA: Diagnosis not present

## 2021-08-11 DIAGNOSIS — S70262A Insect bite (nonvenomous), left hip, initial encounter: Secondary | ICD-10-CM | POA: Diagnosis not present

## 2021-08-11 DIAGNOSIS — S80262A Insect bite (nonvenomous), left knee, initial encounter: Secondary | ICD-10-CM | POA: Diagnosis not present

## 2021-11-05 DIAGNOSIS — Z1231 Encounter for screening mammogram for malignant neoplasm of breast: Secondary | ICD-10-CM | POA: Diagnosis not present

## 2021-11-07 DIAGNOSIS — L603 Nail dystrophy: Secondary | ICD-10-CM | POA: Diagnosis not present

## 2021-11-18 DIAGNOSIS — Z6835 Body mass index (BMI) 35.0-35.9, adult: Secondary | ICD-10-CM | POA: Diagnosis not present

## 2021-11-18 DIAGNOSIS — Z1151 Encounter for screening for human papillomavirus (HPV): Secondary | ICD-10-CM | POA: Diagnosis not present

## 2021-11-18 DIAGNOSIS — Z01419 Encounter for gynecological examination (general) (routine) without abnormal findings: Secondary | ICD-10-CM | POA: Diagnosis not present

## 2021-11-18 DIAGNOSIS — Z124 Encounter for screening for malignant neoplasm of cervix: Secondary | ICD-10-CM | POA: Diagnosis not present

## 2021-11-19 DIAGNOSIS — Z23 Encounter for immunization: Secondary | ICD-10-CM | POA: Diagnosis not present

## 2021-11-19 DIAGNOSIS — E782 Mixed hyperlipidemia: Secondary | ICD-10-CM | POA: Diagnosis not present

## 2021-11-19 DIAGNOSIS — E1165 Type 2 diabetes mellitus with hyperglycemia: Secondary | ICD-10-CM | POA: Diagnosis not present

## 2021-11-19 DIAGNOSIS — I10 Essential (primary) hypertension: Secondary | ICD-10-CM | POA: Diagnosis not present

## 2021-12-01 DIAGNOSIS — H2513 Age-related nuclear cataract, bilateral: Secondary | ICD-10-CM | POA: Diagnosis not present

## 2021-12-01 DIAGNOSIS — E119 Type 2 diabetes mellitus without complications: Secondary | ICD-10-CM | POA: Diagnosis not present

## 2021-12-01 DIAGNOSIS — H52223 Regular astigmatism, bilateral: Secondary | ICD-10-CM | POA: Diagnosis not present

## 2021-12-01 DIAGNOSIS — H5203 Hypermetropia, bilateral: Secondary | ICD-10-CM | POA: Diagnosis not present

## 2021-12-01 DIAGNOSIS — H524 Presbyopia: Secondary | ICD-10-CM | POA: Diagnosis not present

## 2022-01-20 DIAGNOSIS — L820 Inflamed seborrheic keratosis: Secondary | ICD-10-CM | POA: Diagnosis not present

## 2022-01-20 DIAGNOSIS — L578 Other skin changes due to chronic exposure to nonionizing radiation: Secondary | ICD-10-CM | POA: Diagnosis not present

## 2022-01-20 DIAGNOSIS — L719 Rosacea, unspecified: Secondary | ICD-10-CM | POA: Diagnosis not present

## 2022-01-20 DIAGNOSIS — L409 Psoriasis, unspecified: Secondary | ICD-10-CM | POA: Diagnosis not present

## 2022-01-20 DIAGNOSIS — D225 Melanocytic nevi of trunk: Secondary | ICD-10-CM | POA: Diagnosis not present

## 2022-04-29 ENCOUNTER — Other Ambulatory Visit: Payer: Self-pay | Admitting: Family Medicine

## 2022-04-29 DIAGNOSIS — R1011 Right upper quadrant pain: Secondary | ICD-10-CM

## 2022-05-06 ENCOUNTER — Other Ambulatory Visit: Payer: Commercial Managed Care - HMO

## 2022-05-12 ENCOUNTER — Ambulatory Visit (INDEPENDENT_AMBULATORY_CARE_PROVIDER_SITE_OTHER): Payer: Commercial Managed Care - HMO

## 2022-05-12 DIAGNOSIS — R1011 Right upper quadrant pain: Secondary | ICD-10-CM

## 2022-11-25 ENCOUNTER — Other Ambulatory Visit: Payer: Self-pay | Admitting: Obstetrics and Gynecology

## 2022-11-25 DIAGNOSIS — R928 Other abnormal and inconclusive findings on diagnostic imaging of breast: Secondary | ICD-10-CM

## 2022-11-30 ENCOUNTER — Other Ambulatory Visit: Payer: Commercial Managed Care - HMO

## 2022-12-07 ENCOUNTER — Ambulatory Visit
Admission: RE | Admit: 2022-12-07 | Discharge: 2022-12-07 | Disposition: A | Discharge status: Home / Self Care | Payer: Managed Care, Other (non HMO) | Source: Ambulatory Visit | Attending: Obstetrics and Gynecology | Admitting: Obstetrics and Gynecology

## 2022-12-07 ENCOUNTER — Ambulatory Visit
Admission: RE | Admit: 2022-12-07 | Discharge: 2022-12-07 | Disposition: A | Discharge status: Home / Self Care | Payer: Commercial Managed Care - HMO | Source: Ambulatory Visit | Attending: Obstetrics and Gynecology | Admitting: Obstetrics and Gynecology

## 2022-12-07 ENCOUNTER — Other Ambulatory Visit: Payer: Self-pay | Admitting: Obstetrics and Gynecology

## 2022-12-07 DIAGNOSIS — R928 Other abnormal and inconclusive findings on diagnostic imaging of breast: Secondary | ICD-10-CM

## 2022-12-07 DIAGNOSIS — N6311 Unspecified lump in the right breast, upper outer quadrant: Secondary | ICD-10-CM

## 2023-06-07 ENCOUNTER — Ambulatory Visit
Admission: RE | Admit: 2023-06-07 | Discharge: 2023-06-07 | Disposition: A | Discharge status: Home / Self Care | Payer: Managed Care, Other (non HMO) | Source: Ambulatory Visit | Attending: Obstetrics and Gynecology | Admitting: Obstetrics and Gynecology

## 2023-06-07 DIAGNOSIS — R928 Other abnormal and inconclusive findings on diagnostic imaging of breast: Secondary | ICD-10-CM

## 2023-06-07 DIAGNOSIS — N6311 Unspecified lump in the right breast, upper outer quadrant: Secondary | ICD-10-CM
